# Patient Record
Sex: Male | Born: 1985 | Race: Black or African American | Hispanic: No | Marital: Single | Smoking: Former smoker
Health system: Southern US, Community
[De-identification: ages and names within clinical notes are randomized; demographics above are authoritative.]

## PROBLEM LIST (undated history)

## (undated) DIAGNOSIS — K219 Gastro-esophageal reflux disease without esophagitis: Secondary | ICD-10-CM

## (undated) HISTORY — DX: Gastro-esophageal reflux disease without esophagitis: K21.9

---

## 2009-07-14 ENCOUNTER — Emergency Department (HOSPITAL_BASED_OUTPATIENT_CLINIC_OR_DEPARTMENT_OTHER): Admission: EM | Admit: 2009-07-14 | Discharge: 2009-07-14 | Payer: Self-pay | Admitting: Emergency Medicine

## 2010-05-06 ENCOUNTER — Emergency Department (HOSPITAL_BASED_OUTPATIENT_CLINIC_OR_DEPARTMENT_OTHER)
Admission: EM | Admit: 2010-05-06 | Discharge: 2010-05-06 | Disposition: A | Discharge status: Home / Self Care | Payer: Self-pay | Attending: Emergency Medicine | Admitting: Emergency Medicine

## 2010-05-06 DIAGNOSIS — R1013 Epigastric pain: Secondary | ICD-10-CM | POA: Insufficient documentation

## 2010-05-06 DIAGNOSIS — K2970 Gastritis, unspecified, without bleeding: Secondary | ICD-10-CM | POA: Insufficient documentation

## 2010-05-06 DIAGNOSIS — K2990 Gastroduodenitis, unspecified, without bleeding: Secondary | ICD-10-CM | POA: Insufficient documentation

## 2010-05-06 DIAGNOSIS — R1110 Vomiting, unspecified: Secondary | ICD-10-CM | POA: Insufficient documentation

## 2010-05-06 DIAGNOSIS — J029 Acute pharyngitis, unspecified: Secondary | ICD-10-CM | POA: Insufficient documentation

## 2010-05-06 LAB — CBC
HCT: 44.1 % (ref 39.0–52.0)
MCH: 29.2 pg (ref 26.0–34.0)
MCHC: 34.5 g/dL (ref 30.0–36.0)
MCV: 84.8 fL (ref 78.0–100.0)
Platelets: 250 10*3/uL (ref 150–400)
RDW: 12.5 % (ref 11.5–15.5)

## 2010-05-06 LAB — DIFFERENTIAL
Eosinophils Absolute: 0 10*3/uL (ref 0.0–0.7)
Eosinophils Relative: 0 % (ref 0–5)
Lymphocytes Relative: 11 % — ABNORMAL LOW (ref 12–46)
Lymphs Abs: 0.8 10*3/uL (ref 0.7–4.0)
Monocytes Absolute: 0.9 10*3/uL (ref 0.1–1.0)

## 2010-05-06 LAB — COMPREHENSIVE METABOLIC PANEL
Albumin: 4.8 g/dL (ref 3.5–5.2)
Alkaline Phosphatase: 60 U/L (ref 39–117)
BUN: 10 mg/dL (ref 6–23)
Calcium: 9.8 mg/dL (ref 8.4–10.5)
Creatinine, Ser: 0.9 mg/dL (ref 0.4–1.5)
Glucose, Bld: 96 mg/dL (ref 70–99)
Total Protein: 8.5 g/dL — ABNORMAL HIGH (ref 6.0–8.3)

## 2010-05-06 LAB — LIPASE, BLOOD: Lipase: 46 U/L (ref 23–300)

## 2010-06-17 LAB — URINALYSIS, ROUTINE W REFLEX MICROSCOPIC
Ketones, ur: NEGATIVE mg/dL
Nitrite: NEGATIVE
Specific Gravity, Urine: 1.021 (ref 1.005–1.030)
pH: 6 (ref 5.0–8.0)

## 2010-06-17 LAB — GC/CHLAMYDIA PROBE AMP, GENITAL: GC Probe Amp, Genital: NEGATIVE

## 2010-06-17 LAB — RPR: RPR Ser Ql: NONREACTIVE

## 2010-07-15 ENCOUNTER — Emergency Department (INDEPENDENT_AMBULATORY_CARE_PROVIDER_SITE_OTHER): Payer: Self-pay

## 2010-07-15 ENCOUNTER — Emergency Department (HOSPITAL_BASED_OUTPATIENT_CLINIC_OR_DEPARTMENT_OTHER)
Admission: EM | Admit: 2010-07-15 | Discharge: 2010-07-15 | Disposition: A | Discharge status: Home / Self Care | Payer: Self-pay | Attending: Emergency Medicine | Admitting: Emergency Medicine

## 2010-07-15 DIAGNOSIS — R369 Urethral discharge, unspecified: Secondary | ICD-10-CM | POA: Insufficient documentation

## 2010-07-15 DIAGNOSIS — N342 Other urethritis: Secondary | ICD-10-CM | POA: Insufficient documentation

## 2010-07-15 DIAGNOSIS — R079 Chest pain, unspecified: Secondary | ICD-10-CM | POA: Insufficient documentation

## 2010-07-15 DIAGNOSIS — F1210 Cannabis abuse, uncomplicated: Secondary | ICD-10-CM | POA: Insufficient documentation

## 2010-12-20 ENCOUNTER — Encounter: Payer: Self-pay | Admitting: *Deleted

## 2010-12-20 ENCOUNTER — Emergency Department (HOSPITAL_BASED_OUTPATIENT_CLINIC_OR_DEPARTMENT_OTHER)
Admission: EM | Admit: 2010-12-20 | Discharge: 2010-12-20 | Disposition: A | Discharge status: Home / Self Care | Payer: Self-pay | Attending: Emergency Medicine | Admitting: Emergency Medicine

## 2010-12-20 DIAGNOSIS — R109 Unspecified abdominal pain: Secondary | ICD-10-CM | POA: Insufficient documentation

## 2010-12-20 DIAGNOSIS — F17200 Nicotine dependence, unspecified, uncomplicated: Secondary | ICD-10-CM | POA: Insufficient documentation

## 2010-12-20 DIAGNOSIS — R1030 Lower abdominal pain, unspecified: Secondary | ICD-10-CM

## 2010-12-20 LAB — URINALYSIS, MICROSCOPIC ONLY
Glucose, UA: NEGATIVE mg/dL
Hgb urine dipstick: NEGATIVE
Ketones, ur: NEGATIVE mg/dL
Leukocytes, UA: NEGATIVE
pH: 5.5 (ref 5.0–8.0)

## 2010-12-20 MED ORDER — AZITHROMYCIN 1 G PO PACK: 1.0000 g | PACK | Freq: Once | ORAL | Status: DC

## 2010-12-20 MED ORDER — METRONIDAZOLE 500 MG PO TABS
2000.0000 mg | ORAL_TABLET | Freq: Once | ORAL | Status: AC
  Administered 2010-12-20: 2000 mg via ORAL
  Filled 2010-12-20: qty 4

## 2010-12-20 MED ORDER — CEFTRIAXONE SODIUM 250 MG IJ SOLR
250.0000 mg | Freq: Once | INTRAMUSCULAR | Status: AC
  Administered 2010-12-20: 250 mg via INTRAMUSCULAR
  Filled 2010-12-20: qty 250

## 2010-12-20 MED ORDER — AZITHROMYCIN 250 MG PO TABS
1000.0000 mg | ORAL_TABLET | Freq: Once | ORAL | Status: AC
  Administered 2010-12-20: 1000 mg via ORAL
  Filled 2010-12-20: qty 4

## 2010-12-20 NOTE — ED Provider Notes (Signed)
History     CSN: [id number] Arrival date & time: 12/20/2010  2:09 PM  Chief Complaint  Patient presents with  . Groin Pain    HPI  (Consider location/radiation/quality/duration/timing/severity/associated sxs/prior treatment)  Patient is a 25 y.o. male presenting with groin pain. The history is provided by the patient.  Groin Pain This is a new problem. The current episode started 12 to 24 hours ago. The problem occurs constantly. The problem has not changed since onset.Pertinent negatives include no chest pain, no abdominal pain, no headaches and no shortness of breath. He has tried nothing for the symptoms.  ENCOUNTER WITH [employer]IPPER AND CONCERNED ABOUT STD HAD BILAT GROIN PAIN FOR ONE DAY. NO DISCHARGE NO MASS.   History reviewed. No pertinent past medical history.  History reviewed. No pertinent past surgical history.  No family history on file.  History  Substance Use Topics  . Smoking status: Current Everyday Smoker  . Smokeless tobacco: Not on file  . Alcohol Use: Yes      Review of Systems  Review of Systems  Constitutional: Negative for fever.  HENT: Negative for neck pain.   Respiratory: Negative for cough and shortness of breath.   Cardiovascular: Negative for chest pain.  Gastrointestinal: Negative for nausea and abdominal pain.  Genitourinary: Negative for dysuria, hematuria, flank pain, scrotal swelling, genital sores, penile pain and testicular pain.  Musculoskeletal: Negative for back pain.  Neurological: Negative for headaches.  Hematological: Negative for adenopathy.    Allergies  Review of patient's allergies indicates no known allergies.  Home Medications  No current outpatient prescriptions on file.  Physical Exam    BP 120/66  Pulse 82  Temp(Src) 98.2 F (36.8 C) (Oral)  Resp 18  Ht 5\' 9"  (1.753 m)  Wt 185 lb (83.915 kg)  BMI 27.32 kg/m2  SpO2 97%  Physical Exam  Nursing note and vitals reviewed. Constitutional: He is oriented to  person, place, and time. He appears well-developed and well-nourished. No distress.  HENT:  Head: Normocephalic and atraumatic.  Mouth/Throat: Oropharynx is clear and moist.  Eyes: Conjunctivae and EOM are normal. Pupils are equal, round, and reactive to light.  Neck: Normal range of motion. Neck supple.  Cardiovascular: Normal rate, regular rhythm and normal heart sounds.   Pulmonary/Chest: Effort normal and breath sounds normal.  Abdominal: Soft. Bowel sounds are normal. He exhibits no mass. There is no tenderness.  Genitourinary: Penis normal. No penile tenderness.       NO HERNIA NO ADENOPATHY NO DISCHARGE NO LESIONS.   Musculoskeletal: Normal range of motion. He exhibits no tenderness.  Neurological: He is alert and oriented to person, place, and time.  Skin: Skin is warm. No rash noted.    ED Course  Procedures (including critical care time)  Labs Reviewed  URINALYSIS, MICROSCOPIC ONLY - Abnormal; Notable for the following:    Bacteria, UA MANY (*)    All other components within normal limits   No results found.   1. Groin pain      MDM CLINICALLY NORMAL GU EXAM NO EVIDENCE OF STD OR HERNIA BUT PATIENT CONCERNED ABOUT RECENT ENCOUNTER - WILL RX FOR STD.           Shelda Jakes, MD 12/20/10 [phone]

## 2010-12-20 NOTE — ED Notes (Signed)
Pt states he has had intermittent groin pain for 2-3 days. Describes as sharp. Denies UTI S/S or d/c.

## 2010-12-20 NOTE — ED Notes (Signed)
D/c home with ride- no rx given 

## 2012-01-12 ENCOUNTER — Encounter (HOSPITAL_BASED_OUTPATIENT_CLINIC_OR_DEPARTMENT_OTHER): Payer: Self-pay | Admitting: *Deleted

## 2012-01-12 ENCOUNTER — Emergency Department (HOSPITAL_BASED_OUTPATIENT_CLINIC_OR_DEPARTMENT_OTHER)
Admission: EM | Admit: 2012-01-12 | Discharge: 2012-01-12 | Disposition: A | Discharge status: Home / Self Care | Payer: Self-pay | Attending: Emergency Medicine | Admitting: Emergency Medicine

## 2012-01-12 DIAGNOSIS — Z87891 Personal history of nicotine dependence: Secondary | ICD-10-CM | POA: Insufficient documentation

## 2012-01-12 DIAGNOSIS — Z202 Contact with and (suspected) exposure to infections with a predominantly sexual mode of transmission: Secondary | ICD-10-CM | POA: Insufficient documentation

## 2012-01-12 MED ORDER — AZITHROMYCIN 1 G PO PACK
1.0000 g | PACK | Freq: Once | ORAL | Status: AC
  Administered 2012-01-12: 1 g via ORAL
  Filled 2012-01-12: qty 1

## 2012-01-12 MED ORDER — CEFTRIAXONE SODIUM 250 MG IJ SOLR
250.0000 mg | Freq: Once | INTRAMUSCULAR | Status: AC
  Administered 2012-01-12: 250 mg via INTRAMUSCULAR
  Filled 2012-01-12: qty 250

## 2012-01-12 MED ORDER — LIDOCAINE HCL (PF) 1 % IJ SOLN
INTRAMUSCULAR | Status: AC
  Administered 2012-01-12: 5 mL
  Filled 2012-01-12: qty 5

## 2012-01-12 NOTE — ED Notes (Signed)
Cramp like pain in his lower abdomen x 2 weeks. Denies nausea or diarrhea.

## 2012-01-12 NOTE — ED Provider Notes (Signed)
History     CSN: [id number]  Arrival date & time 01/12/12  1751   First MD Initiated Contact with Patient 01/12/12 1800      Chief Complaint  Patient presents with  . Abdominal Pain     HPI Patient recently found out that his pregnant girlfriend has chlamydia.  She sent him here for treatment.  Patient has had no symptoms of drainage or dysuria.  Patient's has no other previous medical history.  Denies fever chills nausea vomiting or diarrhea.  Denies rash. History reviewed. No pertinent past medical history.  History reviewed. No pertinent past surgical history.  No family history on file.  History  Substance Use Topics  . Smoking status: Former Games developer  . Smokeless tobacco: Not on file  . Alcohol Use: Yes      Review of Systems  All other systems reviewed and are negative.    Allergies  Review of patient's allergies indicates no known allergies.  Home Medications  No current outpatient prescriptions on file.  BP 146/72  Pulse 70  Temp 98.3 F (36.8 C) (Oral)  Resp 16  SpO2 100%  Physical Exam  Nursing note and vitals reviewed. Constitutional: He is oriented to person, place, and time. He appears well-developed and well-nourished. No distress.  HENT:  Head: Normocephalic and atraumatic.  Eyes: Pupils are equal, round, and reactive to light.  Neck: Normal range of motion.  Cardiovascular: Normal rate and intact distal pulses.   Pulmonary/Chest: No respiratory distress.  Abdominal: Normal appearance. He exhibits no distension.  Genitourinary: Penis normal.       No rash no urethral discharge  Musculoskeletal: Normal range of motion.  Neurological: He is alert and oriented to person, place, and time. No cranial nerve deficit.  Skin: Skin is warm and dry. No rash noted.  Psychiatric: He has a normal mood and affect. His behavior is normal.    ED Course  Procedures (including critical care time)  Medications  cefTRIAXone (ROCEPHIN) injection 250 mg  (not administered)  azithromycin (ZITHROMAX) powder 1 g (not administered)    Labs Reviewed - No data to display No results found.   1. Exposure to STD       MDM          Nelia Shi, MD 01/13/12 1159

## 2012-11-02 ENCOUNTER — Emergency Department (HOSPITAL_COMMUNITY)
Admission: EM | Admit: 2012-11-02 | Discharge: 2012-11-02 | Disposition: A | Discharge status: Home / Self Care | Payer: Self-pay | Attending: Emergency Medicine | Admitting: Emergency Medicine

## 2012-11-02 ENCOUNTER — Encounter (HOSPITAL_COMMUNITY): Payer: Self-pay | Admitting: Emergency Medicine

## 2012-11-02 ENCOUNTER — Emergency Department (HOSPITAL_COMMUNITY): Payer: Self-pay

## 2012-11-02 DIAGNOSIS — S41009A Unspecified open wound of unspecified shoulder, initial encounter: Secondary | ICD-10-CM | POA: Insufficient documentation

## 2012-11-02 DIAGNOSIS — S46909A Unspecified injury of unspecified muscle, fascia and tendon at shoulder and upper arm level, unspecified arm, initial encounter: Secondary | ICD-10-CM | POA: Insufficient documentation

## 2012-11-02 DIAGNOSIS — Y92410 Unspecified street and highway as the place of occurrence of the external cause: Secondary | ICD-10-CM | POA: Insufficient documentation

## 2012-11-02 DIAGNOSIS — S4980XA Other specified injuries of shoulder and upper arm, unspecified arm, initial encounter: Secondary | ICD-10-CM | POA: Insufficient documentation

## 2012-11-02 DIAGNOSIS — M25512 Pain in left shoulder: Secondary | ICD-10-CM

## 2012-11-02 DIAGNOSIS — Y9389 Activity, other specified: Secondary | ICD-10-CM | POA: Insufficient documentation

## 2012-11-02 DIAGNOSIS — Z87891 Personal history of nicotine dependence: Secondary | ICD-10-CM | POA: Insufficient documentation

## 2012-11-02 DIAGNOSIS — S8990XA Unspecified injury of unspecified lower leg, initial encounter: Secondary | ICD-10-CM | POA: Insufficient documentation

## 2012-11-02 NOTE — ED Notes (Signed)
Attempted to d/c pt, but [employer] interviewing pt and they did not want me to come in room at this time.
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
## 2012-11-03 NOTE — ED Provider Notes (Signed)
Medical screening examination/treatment/procedure(s) were performed by non-physician practitioner and as supervising physician I was immediately available for consultation/collaboration.  Flint Melter, MD 11/03/12 1416
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
## 2017-08-25 ENCOUNTER — Emergency Department (HOSPITAL_COMMUNITY)
Admission: EM | Admit: 2017-08-25 | Discharge: 2017-08-25 | Disposition: A | Discharge status: Home / Self Care | Payer: Self-pay | Attending: Emergency Medicine | Admitting: Emergency Medicine

## 2017-08-25 ENCOUNTER — Encounter (HOSPITAL_COMMUNITY): Payer: Self-pay | Admitting: Emergency Medicine

## 2017-08-25 DIAGNOSIS — Z711 Person with feared health complaint in whom no diagnosis is made: Secondary | ICD-10-CM

## 2017-08-25 DIAGNOSIS — Z202 Contact with and (suspected) exposure to infections with a predominantly sexual mode of transmission: Secondary | ICD-10-CM | POA: Insufficient documentation

## 2017-08-25 DIAGNOSIS — Z87891 Personal history of nicotine dependence: Secondary | ICD-10-CM | POA: Insufficient documentation

## 2017-08-25 LAB — URINALYSIS, ROUTINE W REFLEX MICROSCOPIC
Bilirubin Urine: NEGATIVE
GLUCOSE, UA: NEGATIVE mg/dL
Hgb urine dipstick: NEGATIVE
KETONES UR: NEGATIVE mg/dL
LEUKOCYTES UA: NEGATIVE
Nitrite: NEGATIVE
PROTEIN: NEGATIVE mg/dL
Specific Gravity, Urine: 1.02 (ref 1.005–1.030)
pH: 7 (ref 5.0–8.0)

## 2017-08-25 MED ORDER — AZITHROMYCIN 250 MG PO TABS
1000.0000 mg | ORAL_TABLET | Freq: Once | ORAL | Status: AC
  Administered 2017-08-25: 1000 mg via ORAL
  Filled 2017-08-25: qty 4

## 2017-08-25 MED ORDER — CEFTRIAXONE SODIUM 250 MG IJ SOLR
250.0000 mg | Freq: Once | INTRAMUSCULAR | Status: AC
Start: 2017-08-25 — End: 2017-08-25
  Administered 2017-08-25: 250 mg via INTRAMUSCULAR
  Filled 2017-08-25: qty 250

## 2017-08-25 NOTE — ED Provider Notes (Signed)
[facility] EMERGENCY DEPARTMENT Provider Note   CSN: [id number] Arrival date & time: 08/25/17  1952     History   Chief Complaint Chief Complaint  Patient presents with  . Exposure to STD    HPI Keith Rodriguez is a 32 y.o. male with no significant past medical history presents today requesting STD testing.  He states his "baby mama" recently told him that she tested positive for chlamydia.  Last sexual intercourse was 2 days ago.  He states in the past 6 months he has not had any other sexual partners.  He denies any symptoms at this time.  He denies abnormal penile drainage, dysuria, urinary urgency or frequency, hematuria, genital lesions, scrotal swelling, testicular pain, or pain with bowel movements.  Denies abdominal pain, nausea, vomiting, fevers, or chills.  He declines testing for HIV, syphilis or genital exam today and states "I just want treatment ".  The history is provided by the patient.    History reviewed. No pertinent past medical history.  There are no active problems to display for this patient.   History reviewed. No pertinent surgical history.      Home Medications    Prior to Admission medications   Not on File    Family History History reviewed. No pertinent family history.  Social History Social History   Tobacco Use  . Smoking status: Former Smoker  Substance Use Topics  . Alcohol use: Yes  . Drug use: No     Allergies   Patient has no known allergies.   Review of Systems Review of Systems  Gastrointestinal: Negative for abdominal pain, diarrhea, nausea and vomiting.  Genitourinary: Negative for decreased urine volume, discharge, dysuria, frequency, hematuria, penile pain, penile swelling, scrotal swelling, testicular pain and urgency.  Musculoskeletal: Negative for back pain.     Physical Exam Updated Vital Signs BP (!) 145/91 (BP Location: Right Arm)   Pulse 69   Temp 98.7 F (37.1 C) (Oral)   Resp 18    Ht  (1.753 m)   Wt 97.5 kg (215 lb)   SpO2 100%   BMI 31.75 kg/m   Physical Exam  Constitutional: He appears well-developed and well-nourished. No distress.  HENT:  Head: Normocephalic and atraumatic.  Eyes: Conjunctivae are normal. Right eye exhibits no discharge. Left eye exhibits no discharge.  Neck: No JVD present. No tracheal deviation present.  Cardiovascular: Normal rate, regular rhythm and normal heart sounds.  Pulmonary/Chest: Effort normal and breath sounds normal.  Abdominal: Soft. Bowel sounds are normal. He exhibits no distension. There is no tenderness. There is no guarding.  No CVA tenderness  Genitourinary:  Genitourinary Comments: Patient declined.   Musculoskeletal: He exhibits no edema.  Neurological: He is alert.  Skin: Skin is warm and dry. No erythema.  Psychiatric: He has a normal mood and affect. His behavior is normal.  Nursing note and vitals reviewed.    ED Treatments / Results  Labs (all labs ordered are listed, but only abnormal results are displayed) Labs Reviewed  URINALYSIS, ROUTINE W REFLEX MICROSCOPIC  GC/CHLAMYDIA PROBE AMP ([place]) NOT AT [facility]    EKG None  Radiology No results found.  Procedures Procedures (including critical care time)  Medications Ordered in ED Medications  azithromycin (ZITHROMAX) tablet 1,000 mg (1,000 mg Oral Given 08/25/17 2251)  cefTRIAXone (ROCEPHIN) injection 250 mg (250 mg Intramuscular Given 08/25/17 2252)     Initial Impression / Assessment and Plan / ED Course  I have reviewed  the triage vital signs and the nursing notes.  Pertinent labs & imaging results that were available during my care of the patient were reviewed by me and considered in my medical decision making (see chart for details).    Patient presents requesting STD testing after being told by his significant other that she tested positive for chlamydia.  Patient is afebrile without abdominal tenderness, abdominal pain or  painful bowel movements to indicate prostatitis.  He declines GU exam but also denies any symptoms of STD and so I have a low suspicion of orchitis, epididymitis, or prostatitis.   STD cultures obtained including gonorrhea and chlamydia.  UA is not concerning for testing for HIV or syphilis despite my examination of this is the CDC recommendation. Discussed importance of using protection when sexually active. Pt understands that they have GC/Chlamydia cultures pending and that they will need to inform all sexual partners if results return positive. Patient has been treated prophylactically with azithromycin and Rocephin.  He was also instructed to follow-up at the [facility] department if he wishes any other STD testing in the future.  Final Clinical Impressions(s) / ED Diagnoses   Final diagnoses:  Concern about STD in male without diagnosis  STD exposure    ED Discharge Orders    None       Bennye Alm 08/25/17 2344    Benjiman Core, MD 08/27/17 [phone]

## 2017-08-25 NOTE — ED Triage Notes (Signed)
Pt presents with concern for STD exposure; states his girlfriend recently told him she had chlymidia; last intercourse 2 days ago; pt denies symptoms currently but wants tested and treatment

## 2017-08-25 NOTE — Discharge Instructions (Signed)
Follow up with [facility] STD clinic to be screened for HIV in the future and for future STD concerns or screenings. This is the recommendation by the CDC for people with multiple sexual partners or hx of STDs. You have been treated for gonorrhea and chlamydia in the ER but the hospital will call you if lab is positive.  Do not have sexual intercourse for 7 days after antibiotic treatment. ° °
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
## 2018-02-03 ENCOUNTER — Other Ambulatory Visit: Payer: Self-pay

## 2018-02-03 ENCOUNTER — Encounter: Payer: Self-pay | Admitting: Family Medicine

## 2018-02-03 ENCOUNTER — Ambulatory Visit: Payer: 59 | Admitting: Family Medicine

## 2018-02-03 VITALS — BP 126/79 | HR 56 | Temp 99.1°F | Resp 17 | Ht 68.19 in | Wt 216.2 lb

## 2018-02-03 DIAGNOSIS — K219 Gastro-esophageal reflux disease without esophagitis: Secondary | ICD-10-CM

## 2018-02-03 DIAGNOSIS — Z840 Family history of diseases of the skin and subcutaneous tissue: Secondary | ICD-10-CM | POA: Diagnosis not present

## 2018-02-03 DIAGNOSIS — Z23 Encounter for immunization: Secondary | ICD-10-CM

## 2018-02-03 DIAGNOSIS — E6609 Other obesity due to excess calories: Secondary | ICD-10-CM

## 2018-02-03 DIAGNOSIS — R1032 Left lower quadrant pain: Secondary | ICD-10-CM

## 2018-02-03 DIAGNOSIS — Z6832 Body mass index (BMI) 32.0-32.9, adult: Secondary | ICD-10-CM

## 2018-02-03 DIAGNOSIS — E66811 Obesity, class 1: Secondary | ICD-10-CM

## 2018-02-03 DIAGNOSIS — K644 Residual hemorrhoidal skin tags: Secondary | ICD-10-CM

## 2018-02-03 DIAGNOSIS — Z7689 Persons encountering health services in other specified circumstances: Secondary | ICD-10-CM | POA: Diagnosis not present

## 2018-02-03 DIAGNOSIS — Z8349 Family history of other endocrine, nutritional and metabolic diseases: Secondary | ICD-10-CM

## 2018-02-03 DIAGNOSIS — R0602 Shortness of breath: Secondary | ICD-10-CM | POA: Diagnosis not present

## 2018-02-03 DIAGNOSIS — Z113 Encounter for screening for infections with a predominantly sexual mode of transmission: Secondary | ICD-10-CM

## 2018-02-03 DIAGNOSIS — N50811 Right testicular pain: Secondary | ICD-10-CM

## 2018-02-03 DIAGNOSIS — R198 Other specified symptoms and signs involving the digestive system and abdomen: Secondary | ICD-10-CM

## 2018-02-03 DIAGNOSIS — Z299 Encounter for prophylactic measures, unspecified: Secondary | ICD-10-CM

## 2018-02-03 MED ORDER — PSYLLIUM 58.6 % PO POWD: 1.0000 | Freq: Two times a day (BID) | ORAL | 12 refills | Status: DC

## 2018-02-03 NOTE — Patient Instructions (Addendum)
   If you have lab work done today you will be contacted with your lab results within the next 2 weeks.  If you have not heard from us then please contact us. The fastest way to get your results is to register for My Chart.   IF you received an x-ray today, you will receive an invoice from [facility]. Please contact [facility] at [phone] with questions or concerns regarding your invoice.   IF you received labwork today, you will receive an invoice from [facility]. Please contact [facility] at [phone] with questions or concerns regarding your invoice.   Our billing staff will not be able to assist you with questions regarding bills from these companies.  You will be contacted with the lab results as soon as they are available. The fastest way to get your results is to activate your My Chart account. Instructions are located on the last page of this paperwork. If you have not heard from us regarding the results in 2 weeks, please contact this office.     Hemorrhoids Hemorrhoids are swollen veins in and around the rectum or anus. There are two types of hemorrhoids:  Internal hemorrhoids. These occur in the veins that are just inside the rectum. They may poke through to the outside and become irritated and painful.  External hemorrhoids. These occur in the veins that are outside of the anus and can be felt as a painful swelling or hard lump near the anus.  Most hemorrhoids do not cause serious problems, and they can be managed with home treatments such as diet and lifestyle changes. If home treatments do not help your symptoms, procedures can be done to shrink or remove the hemorrhoids. What are the causes? This condition is caused by increased pressure in the anal area. This pressure may result from various things, including:  Constipation.  Straining to have a bowel movement.  Diarrhea.  Pregnancy.  Obesity.  Sitting for long periods of time.  Heavy  lifting or other activity that causes you to strain.  Anal sex.  What are the signs or symptoms? Symptoms of this condition include:  Pain.  Anal itching or irritation.  Rectal bleeding.  Leakage of stool (feces).  Anal swelling.  One or more lumps around the anus.  How is this diagnosed? This condition can often be diagnosed through a visual exam. Other exams or tests may also be done, such as:  Examination of the rectal area with a gloved hand (digital rectal exam).  Examination of the anal canal using a small tube (anoscope).  A blood test, if you have lost a significant amount of blood.  A test to look inside the colon (sigmoidoscopy or colonoscopy).  How is this treated? This condition can usually be treated at home. However, various procedures may be done if dietary changes, lifestyle changes, and other home treatments do not help your symptoms. These procedures can help make the hemorrhoids smaller or remove them completely. Some of these procedures involve surgery, and others do not. Common procedures include:  Rubber band ligation. Rubber bands are placed at the base of the hemorrhoids to cut off the blood supply to them.  Sclerotherapy. Medicine is injected into the hemorrhoids to shrink them.  Infrared coagulation. A type of light energy is used to get rid of the hemorrhoids.  Hemorrhoidectomy surgery. The hemorrhoids are surgically removed, and the veins that supply them are tied off.  Stapled hemorrhoidopexy surgery. A circular stapling device is used to   remove the hemorrhoids and use staples to cut off the blood supply to them.  Follow these instructions at home: Eating and drinking  Eat foods that have a lot of fiber in them, such as whole grains, beans, nuts, fruits, and vegetables. Ask your health care provider about taking products that have added fiber (fiber supplements).  Drink enough fluid to keep your urine clear or pale yellow. Managing pain  and swelling  Take warm sitz baths for 20 minutes, 3-4 times a day to ease pain and discomfort.  If directed, apply ice to the affected area. Using ice packs between sitz baths may be helpful. ? Put ice in a plastic bag. ? Place a towel between your skin and the bag. ? Leave the ice on for 20 minutes, 2-3 times a day. General instructions  Take over-the-counter and prescription medicines only as told by your health care provider.  Use medicated creams or suppositories as told.  Exercise regularly.  Go to the bathroom when you have the urge to have a bowel movement. Do not wait.  Avoid straining to have bowel movements.  Keep the anal area dry and clean. Use wet toilet paper or moist towelettes after a bowel movement.  Do not sit on the toilet for long periods of time. This increases blood pooling and pain. Contact a health care provider if:  You have increasing pain and swelling that are not controlled by treatment or medicine.  You have uncontrolled bleeding.  You have difficulty having a bowel movement, or you are unable to have a bowel movement.  You have pain or inflammation outside the area of the hemorrhoids. This information is not intended to replace advice given to you by your health care provider. Make sure you discuss any questions you have with your health care provider. Document Released: 03/13/2000 Document Revised: 08/14/2015 Document Reviewed: 11/28/2014 Elsevier Interactive Patient Education  2018 Elsevier Inc.  High-Fiber Diet Fiber, also called dietary fiber, is a type of carbohydrate found in fruits, vegetables, whole grains, and beans. A high-fiber diet can have many health benefits. Your health care provider may recommend a high-fiber diet to help:  Prevent constipation. Fiber can make your bowel movements more regular.  Lower your cholesterol.  Relieve hemorrhoids, uncomplicated diverticulosis, or irritable bowel syndrome.  Prevent overeating as  part of a weight-loss plan.  Prevent heart disease, type 2 diabetes, and certain cancers.  What is my plan? The recommended daily intake of fiber includes:  38 grams for men under age 50.  30 grams for men over age 50.  25 grams for women under age 50.  21 grams for women over age 50.  You can get the recommended daily intake of dietary fiber by eating a variety of fruits, vegetables, grains, and beans. Your health care provider may also recommend a fiber supplement if it is not possible to get enough fiber through your diet. What do I need to know about a high-fiber diet?  Fiber supplements have not been widely studied for their effectiveness, so it is better to get fiber through food sources.  Always check the fiber content on thenutrition facts label of any prepackaged food. Look for foods that contain at least 5 grams of fiber per serving.  Ask your dietitian if you have questions about specific foods that are related to your condition, especially if those foods are not listed in the following section.  Increase your daily fiber consumption gradually. Increasing your intake of dietary fiber too quickly   may cause bloating, cramping, or gas.  Drink plenty of water. Water helps you to digest fiber. What foods can I eat? Grains Whole-grain breads. Multigrain cereal. Oats and oatmeal. Brown rice. Barley. Bulgur wheat. Millet. Bran muffins. Popcorn. Rye wafer crackers. Vegetables Sweet potatoes. Spinach. Kale. Artichokes. Cabbage. Broccoli. Green peas. Carrots. Squash. Fruits Berries. Pears. Apples. Oranges. Avocados. Prunes and raisins. Dried figs. Meats and Other Protein Sources Navy, kidney, pinto, and soy beans. Split peas. Lentils. Nuts and seeds. Dairy Fiber-fortified yogurt. Beverages Fiber-fortified soy milk. Fiber-fortified orange juice. Other Fiber bars. The items listed above may not be a complete list of recommended foods or beverages. Contact your dietitian for  more options. What foods are not recommended? Grains White bread. Pasta made with refined flour. White rice. Vegetables Fried potatoes. Canned vegetables. Well-cooked vegetables. Fruits Fruit juice. Cooked, strained fruit. Meats and Other Protein Sources Fatty cuts of meat. Fried poultry or fried fish. Dairy Milk. Yogurt. Cream cheese. Sour cream. Beverages Soft drinks. Other Cakes and pastries. Butter and oils. The items listed above may not be a complete list of foods and beverages to avoid. Contact your dietitian for more information. What are some tips for including high-fiber foods in my diet?  Eat a wide variety of high-fiber foods.  Make sure that half of all grains consumed each day are whole grains.  Replace breads and cereals made from refined flour or white flour with whole-grain breads and cereals.  Replace white rice with brown rice, bulgur wheat, or millet.  Start the day with a breakfast that is high in fiber, such as a cereal that contains at least 5 grams of fiber per serving.  Use beans in place of meat in soups, salads, or pasta.  Eat high-fiber snacks, such as berries, raw vegetables, nuts, or popcorn. This information is not intended to replace advice given to you by your health care provider. Make sure you discuss any questions you have with your health care provider. Document Released: 03/16/2005 Document Revised: 08/22/2015 Document Reviewed: 08/29/2013 Elsevier Interactive Patient Education  2018 Elsevier Inc.  

## 2018-02-04 LAB — H. PYLORI BREATH TEST: H. pylori Breath Test: NOT DETECTED

## 2018-02-04 LAB — GC/CHLAMYDIA PROBE AMP
Chlamydia trachomatis, NAA: NEGATIVE
Neisseria gonorrhoeae by PCR: NEGATIVE

## 2018-02-11 ENCOUNTER — Telehealth: Payer: Self-pay | Admitting: Family Medicine

## 2018-02-11 ENCOUNTER — Ambulatory Visit
Admission: RE | Admit: 2018-02-11 | Discharge: 2018-02-11 | Disposition: A | Discharge status: Home / Self Care | Payer: 59 | Source: Ambulatory Visit | Attending: Family Medicine | Admitting: Family Medicine

## 2018-02-11 DIAGNOSIS — N50811 Right testicular pain: Secondary | ICD-10-CM | POA: Diagnosis not present

## 2018-02-11 NOTE — Telephone Encounter (Signed)
Copied from CRM [id number]. Topic: General - Other >> Feb 11, 2018  2:28 PM Tamela OddiHarris, Brenda J wrote: Reason for CRM: Patient called to inquire about the ultrasound he just had.  Patient stated that they took an ultrasound of his testicles and he wanted the ultrasound to be for his stomach.  Patient stated that this is want the doctor had told him would be done.  Patient would like to speak with the nurse or doctor to find out why the ultrasound was not done on his stomach.  Please advise.  CB# [phone]

## 2018-02-13 NOTE — Telephone Encounter (Signed)
Message sent to Dr. Creta LevinStallings Re: ultrasound - pt questions it was ordered correctly

## 2018-02-15 ENCOUNTER — Ambulatory Visit: Payer: Self-pay

## 2018-02-15 NOTE — Telephone Encounter (Signed)
Patient called he was seen by Dr Creta Levin a few weeks ago for some abdominal issues. Stated that she spoke about getting him scheduled for an abdominal ultrasound but he has heard nothing. He is still vomiting in the mornings, also was not able to get Rx filled that was prescribed for psyllium (METAMUCIL MULTIHEALTH FIBER) 58.6 % powder. Pt c/o left sided abdominal pain. Pt stated that when he drinks water, it burns his stomach. Pt stated that the pain can be felt to his hemorrhoids when he has the abdominal pian. Pt stated that he has had flecks of blood in his emesis.Pt stated that smells can trigger his nausea and vomiting. Pt stated that he has had abdominal pain for 2 months. Care advice given and pt stated that he does not want to come in to a office visit. Pt stated that Dr Creta Levin know everything and that he cannot take off work so often to come in to see the doctor.  Spoke with Malachi Bonds at [facility] and informed her that pt refusing a doctor appointment. Asked to send note to office.  Reason for Disposition . Abdominal pain is a chronic symptom (recurrent or ongoing AND present > 4 weeks)  Answer Assessment - Initial Assessment Questions 1. LOCATION: "Where does it hurt?"      Left side of abdomin when he drinks water on empty stomach it burns his stomach 2. RADIATION: "Does the pain shoot anywhere else?" (e.g., chest, back)     No shoots to hemorrhoids pt stated it burns 3. ONSET: "When did the pain begin?" (Minutes, hours or days ago)      2 months ago 4. SUDDEN: "Gradual or sudden onset?"     suddenly 5. PATTERN "Does the pain come and go, or is it constant?"    - If constant: "Is it getting better, staying the same, or worsening?"      (Note: Constant means the pain never goes away completely; most serious pain is constant and it progresses)     - If intermittent: "How long does it last?" "Do you have pain now?"     (Note: Intermittent means the pain goes away completely between bouts)     Comes and goes-hour and a half- has pain now drinking a smoothie helps occasionally 6. SEVERITY: "How bad is the pain?"  (e.g., Scale 1-10; mild, moderate, or severe)    - MILD (1-3): doesn't interfere with normal activities, abdomen soft and not tender to touch     - MODERATE (4-7): interferes with normal activities or awakens from sleep, tender to touch     - SEVERE (8-10): excruciating pain, doubled over, unable to do any normal activities       Moderate pt stated that it get worse dependant on what he eats- smells can trigger it clear thick phlegm that pt is throwing up 7. RECURRENT SYMPTOM: "Have you ever had this type of abdominal pain before?" If so, ask: "When was the last time?" and "What happened that time?"  no 8. CAUSE: "What do you think is causing the abdominal pain?"     Drinks beer stated that family has stomach issues like ulcers, cousin with Chrons  9. RELIEVING/AGGRAVATING FACTORS: "What makes it better or worse?" (e.g., movement, antacids, bowel movement)     Depends on what he eats-no relieving factors 10. OTHER SYMPTOMS: "Has there been any vomiting, diarrhea, constipation, or urine problems?"       Stated that he is throwing up under an ounce yesterday,  vomiting, hemorrhoids  Protocols used: ABDOMINAL PAIN - MALE-A-AH

## 2018-02-16 ENCOUNTER — Telehealth: Payer: Self-pay | Admitting: Family Medicine

## 2018-02-16 DIAGNOSIS — N50811 Right testicular pain: Secondary | ICD-10-CM

## 2018-02-16 NOTE — Telephone Encounter (Signed)
Tried to call pt to try and reschedule their appt for 04/07/18 due to provider schedule change. I could not leave a message due to the mailbox being full.   If pt calls back, please reschedule pt for a CPE at their convenience.

## 2018-02-16 NOTE — Telephone Encounter (Signed)
Please adv    Copied from CRM [id number]. Topic: General - Inquiry >> Feb 16, 2018  2:29 PM Lynne LoganHudson, Caryn D wrote: Reason for CRM: Pt stated he is waiting to hear back about having a possible second ultrasound. He is still having discomfort and is not clear on what the next step for him is supposed to be. He would like a call back regarding this. Cb#[phone]

## 2018-02-17 NOTE — Telephone Encounter (Signed)
Called but couldn't leave VM.

## 2018-02-17 NOTE — Telephone Encounter (Signed)
FYI

## 2018-02-20 NOTE — Telephone Encounter (Signed)
I do not know what a second ultrasound would show but I am going to refer to the Urologist who may be able to do an exam in the office and do a bedside ultrasound to help figure out the cause. I have placed the referral for testicular pain.

## 2018-02-20 NOTE — Addendum Note (Signed)
Addended by: Collie SiadSTALLINGS, Rik Wadel A on: 02/20/2018 07:10 PM   Modules accepted: Orders

## 2018-02-20 NOTE — Telephone Encounter (Signed)
Called patient. Unable to leave voicemail as the mailbox is full. Patient needs to go to Urgent Care for evaluation if we have no appointment.  Let him know that I sent in a referral for the Urologist to check out his testicular pain but the abdominal pain needs reevaluation because it may need a different imaging study. The fact that he can feel this pain even in his hemorrhoids and there is vomiting is concerning. In the office he did not indicate any of this level of severity.

## 2018-02-21 ENCOUNTER — Telehealth: Payer: Self-pay | Admitting: Family Medicine

## 2018-02-21 NOTE — Telephone Encounter (Signed)
Still unable to reach patient by phone. Unable to leave a msg.

## 2018-02-21 NOTE — Telephone Encounter (Signed)
Copied from CRM [phone]. Topic: General - Other >> Feb 21, 2018  9:46 AM Ronney LionArrington, Shykila A wrote: Reason for CRM: Pt called in wanting to know if Dr. Creta LevinStallings will send him in a medication for his acid Reflux, to the pharmacy on file.

## 2018-02-21 NOTE — Telephone Encounter (Signed)
Patient is calling for lab results- no notes on lab results.

## 2018-02-21 NOTE — Telephone Encounter (Signed)
Copied from CRM [id number]. Topic: General - Other >> Feb 21, 2018  9:42 AM Ronney LionArrington, Shykila A wrote: Reason for CRM: pt called in wanting to have a nurse call him back in regards to his lab work. He says he never received a call.

## 2018-02-22 NOTE — Telephone Encounter (Signed)
Spoke with patient he wants to know what kind of RX will you be calling in for his acid reflex medication. He thinks his stomach pain is coming from the acid reflex and he figures once he get this under control his stomach pain will go away. Patient states he can not keep coming in to the office for the same problem and the problem is not getting fixed. He can not keep taking off work.  Patient also wants to know his lab results.

## 2018-02-22 NOTE — Telephone Encounter (Signed)
Called but unable to leave vm.

## 2018-02-22 NOTE — Telephone Encounter (Signed)
Spoke with patient he wants to know what kind of RX will you be calling in for his acid reflex medication. He thinks his stomach pain is coming from the acid reflex and he figures once he get this under control his stomach pain will go away. Patient states he can not keep coming in to the office for the same problem and the problem is not getting fixed. He can not keep taking off work.  Patient also wants to know his lab results. 

## 2018-02-25 MED ORDER — OMEPRAZOLE 20 MG PO CPDR: 20.0000 mg | DELAYED_RELEASE_CAPSULE | Freq: Every day | ORAL | 3 refills | Status: DC

## 2018-02-25 NOTE — Telephone Encounter (Signed)
L/m for patient to return call.  Need to confirm if he had blood drawn at his 11/07 visit.

## 2018-02-25 NOTE — Telephone Encounter (Signed)
Please let the patient know that his H. Pylori test for reflux was negative. He may start omeprazole. I will send to the pharmacy. He should take omeprazole 20mg  first thing in the morning. He should follow up with [facility] about his groin pain. His ultrasound of the scrotum was normal.

## 2018-02-25 NOTE — Addendum Note (Signed)
Addended by: Collie SiadSTALLINGS, Raeanne Deschler A on: 02/25/2018 04:18 PM   Modules accepted: Orders

## 2018-02-25 NOTE — Telephone Encounter (Signed)
No send out labs resulted from 11/07.  Info given to lab to research.

## 2018-02-26 NOTE — Telephone Encounter (Signed)
TC to patient.  L/m with Dr. Creta LevinStallings' message.  Advised to c/b with questions.

## 2018-03-02 NOTE — Telephone Encounter (Signed)
Pt stated he does not feel very secure in his care. He has not had any results from labs or the ultrasound he had. He has had to reschedule his appts multiple times and he is not very happy with the care that he has received thus far. He would like to be contacted regarding lab results and a physical appt. He would like to change to a different PCP. Please advise CB# [phone]

## 2018-03-02 NOTE — Telephone Encounter (Signed)
Please advise 

## 2018-03-16 ENCOUNTER — Ambulatory Visit: Payer: 59 | Admitting: Emergency Medicine

## 2018-03-26 ENCOUNTER — Emergency Department (HOSPITAL_COMMUNITY)
Admission: EM | Admit: 2018-03-26 | Discharge: 2018-03-26 | Disposition: A | Discharge status: Home / Self Care | Payer: 59 | Attending: Emergency Medicine | Admitting: Emergency Medicine

## 2018-03-26 ENCOUNTER — Encounter (HOSPITAL_COMMUNITY): Payer: Self-pay

## 2018-03-26 DIAGNOSIS — Z79899 Other long term (current) drug therapy: Secondary | ICD-10-CM | POA: Diagnosis not present

## 2018-03-26 DIAGNOSIS — Z202 Contact with and (suspected) exposure to infections with a predominantly sexual mode of transmission: Secondary | ICD-10-CM | POA: Insufficient documentation

## 2018-03-26 DIAGNOSIS — R1030 Lower abdominal pain, unspecified: Secondary | ICD-10-CM | POA: Diagnosis not present

## 2018-03-26 DIAGNOSIS — Z87891 Personal history of nicotine dependence: Secondary | ICD-10-CM | POA: Diagnosis not present

## 2018-03-26 LAB — URINALYSIS, ROUTINE W REFLEX MICROSCOPIC
BILIRUBIN URINE: NEGATIVE
Glucose, UA: NEGATIVE mg/dL
HGB URINE DIPSTICK: NEGATIVE
KETONES UR: NEGATIVE mg/dL
Leukocytes, UA: NEGATIVE
NITRITE: NEGATIVE
PH: 5 (ref 5.0–8.0)
Protein, ur: NEGATIVE mg/dL
SPECIFIC GRAVITY, URINE: 1.02 (ref 1.005–1.030)

## 2018-03-26 MED ORDER — CEFTRIAXONE SODIUM 250 MG IJ SOLR
250.0000 mg | Freq: Once | INTRAMUSCULAR | Status: AC
  Administered 2018-03-26: 250 mg via INTRAMUSCULAR
  Filled 2018-03-26: qty 250

## 2018-03-26 MED ORDER — LIDOCAINE HCL (PF) 1 % IJ SOLN
INTRAMUSCULAR | Status: AC
  Administered 2018-03-26: 5 mL
  Filled 2018-03-26: qty 5

## 2018-03-26 MED ORDER — AZITHROMYCIN 250 MG PO TABS
1000.0000 mg | ORAL_TABLET | Freq: Once | ORAL | Status: AC
  Administered 2018-03-26: 1000 mg via ORAL
  Filled 2018-03-26: qty 4

## 2018-03-26 NOTE — ED Provider Notes (Signed)
[facility] Provider Note   CSN: [id number] Arrival date & time: 03/26/18  1446     History   Chief Complaint Chief Complaint  Patient presents with  . Exposure to STD  . Groin Pain    HPI Keith Ruizaron Rodriguez is a 32 y.o. male.  The history is provided by the patient. No language interpreter was used.  Exposure to STD   Groin Pain      32 year old male presenting with concern for STI.  Patient report for the past 4 to 5 days he noticed some discomfort in his groin region, he also reports discomfort at the tip of his penis, endorsed discomfort and sensation to his lower abdomen.  The symptoms are mild however he admits that he had an unprotected sex and he was concerned for potential STI.  He report prior history of STI including gonorrhea, chlamydia, and trichomonas.  He has had 3 separate sexual partners within the past 6 months, using protection on only on occasion.  He denies any associated fever chills, burning on urination or penile discharge.  No specific treatment tried.  Past Medical History:  Diagnosis Date  . Acid reflux     There are no active problems to display for this patient.   History reviewed. No pertinent surgical history.      Home Medications    Prior to Admission medications   Medication Sig Start Date End Date Taking? Authorizing Provider  omeprazole (PRILOSEC) 20 MG capsule Take 1 capsule (20 mg total) by mouth daily. 02/25/18   Doristine BosworthStallings, Zoe A, MD  psyllium (METAMUCIL MULTIHEALTH FIBER) 58.6 % powder Take 1 packet by mouth 2 (two) times daily at 8 am and 10 pm. 02/03/18   Doristine BosworthStallings, Zoe A, MD    Family History Family History  Problem Relation Age of Onset  . Lupus Mother   . Hypertension Mother   . Hyperlipidemia Mother   . Gout Father   . Hypertension Father   . Hyperlipidemia Father   . Mental illness Father   . Diabetes Sister   . Mental illness Sister   . ADD / ADHD Brother   . ADD / ADHD  Brother     Social History Social History   Tobacco Use  . Smoking status: Former Games developermoker  . Smokeless tobacco: Never Used  Substance Use Topics  . Alcohol use: Yes  . Drug use: No     Allergies   Patient has no known allergies.   Review of Systems Review of Systems  All other systems reviewed and are negative.    Physical Exam Updated Vital Signs BP (!) 148/93 (BP Location: Right Arm)   Pulse 86   Temp 98 F (36.7 C) (Oral)   Resp 16   SpO2 98%   Physical Exam Vitals signs and nursing note reviewed. Exam conducted with a chaperone present.  Constitutional:      General: He is not in acute distress.    Appearance: He is well-developed.  HENT:     Head: Atraumatic.  Eyes:     Conjunctiva/sclera: Conjunctivae normal.  Neck:     Musculoskeletal: Neck supple.  Abdominal:     Tenderness: There is no abdominal tenderness.  Genitourinary:    Penis: Normal.      Scrotum/Testes: Normal.     Comments: No inguinal lymphadenopathy or inguinal hernia noted.  Normal circumcised penis free of lesion or rash, testicles are nontender with normal lie, normal scrotum. Skin:  Findings: No rash.  Neurological:     Mental Status: He is alert.      ED Treatments / Results  Labs (all labs ordered are listed, but only abnormal results are displayed) Labs Reviewed  RPR  HIV ANTIBODY (ROUTINE TESTING W REFLEX)  URINALYSIS, ROUTINE W REFLEX MICROSCOPIC  GC/CHLAMYDIA PROBE AMP ([place]) NOT AT [facility]    EKG None  Radiology No results found.  Procedures Procedures (including critical care time)  Medications Ordered in ED Medications  cefTRIAXone (ROCEPHIN) injection 250 mg (has no administration in time range)  azithromycin (ZITHROMAX) tablet 1,000 mg (has no administration in time range)     Initial Impression / Assessment and Plan / ED Course  I have reviewed the triage vital signs and the nursing notes.  Pertinent labs & imaging results that were  available during my care of the patient were reviewed by me and considered in my medical decision making (see chart for details).     BP (!) 148/93 (BP Location: Right Arm)   Pulse 86   Temp 98 F (36.7 C) (Oral)   Resp 16   SpO2 98%    Final Clinical Impressions(s) / ED Diagnoses   Final diagnoses:  None    ED Discharge Orders    None     4:17 PM Patient presents with concerns of potential STI.  Will perform STI screening as well as given antibiotic for prophylaxis.  Encourage patient to abstain from sexual activities until symptoms completely resolved.   Fayrene Helperran, Drayke Grabel, PA-C 03/26/18 1619    Gwyneth SproutPlunkett, Whitney, MD 03/27/18 1309

## 2018-03-26 NOTE — ED Triage Notes (Signed)
Pt endorses recent unprotected sex and since has had groin discomfort. Hx of GC Chlamydia and this feels the same. VSS

## 2018-03-26 NOTE — ED Notes (Signed)
Patient verbalizes understanding of discharge instructions. Opportunity for questioning and answers were provided. Armband removed by staff, pt discharged from ED ambulatory.   

## 2018-03-27 LAB — HIV ANTIBODY (ROUTINE TESTING W REFLEX): HIV Screen 4th Generation wRfx: NONREACTIVE

## 2018-03-27 LAB — RPR: RPR Ser Ql: NONREACTIVE

## 2018-03-28 LAB — GC/CHLAMYDIA PROBE AMP (~~LOC~~) NOT AT ARMC
CHLAMYDIA, DNA PROBE: NEGATIVE
NEISSERIA GONORRHEA: NEGATIVE

## 2018-04-05 ENCOUNTER — Encounter: Payer: 59 | Admitting: Family Medicine

## 2018-04-07 ENCOUNTER — Encounter: Payer: 59 | Admitting: Family Medicine

## 2018-04-10 ENCOUNTER — Other Ambulatory Visit: Payer: Self-pay

## 2018-04-10 ENCOUNTER — Emergency Department
Admission: EM | Admit: 2018-04-10 | Discharge: 2018-04-10 | Disposition: A | Discharge status: Home / Self Care | Payer: 59 | Attending: Emergency Medicine | Admitting: Emergency Medicine

## 2018-04-10 DIAGNOSIS — R300 Dysuria: Secondary | ICD-10-CM | POA: Insufficient documentation

## 2018-04-10 DIAGNOSIS — Z202 Contact with and (suspected) exposure to infections with a predominantly sexual mode of transmission: Secondary | ICD-10-CM

## 2018-04-10 DIAGNOSIS — R109 Unspecified abdominal pain: Secondary | ICD-10-CM | POA: Diagnosis not present

## 2018-04-10 LAB — URINALYSIS, COMPLETE (UACMP) WITH MICROSCOPIC
Bacteria, UA: NONE SEEN
Bilirubin Urine: NEGATIVE
GLUCOSE, UA: NEGATIVE mg/dL
HGB URINE DIPSTICK: NEGATIVE
Ketones, ur: 20 mg/dL — AB
Leukocytes, UA: NEGATIVE
Nitrite: NEGATIVE
PH: 5 (ref 5.0–8.0)
Protein, ur: NEGATIVE mg/dL
Specific Gravity, Urine: 1.028 (ref 1.005–1.030)

## 2018-04-10 LAB — CHLAMYDIA/NGC RT PCR (ARMC ONLY)
Chlamydia Tr: NOT DETECTED
N GONORRHOEAE: NOT DETECTED

## 2018-04-10 MED ORDER — CEFTRIAXONE SODIUM 250 MG IJ SOLR
250.0000 mg | Freq: Once | INTRAMUSCULAR | Status: AC
  Administered 2018-04-10: 250 mg via INTRAMUSCULAR
  Filled 2018-04-10: qty 250

## 2018-04-10 MED ORDER — AZITHROMYCIN 500 MG PO TABS
1000.0000 mg | ORAL_TABLET | Freq: Once | ORAL | Status: AC
  Administered 2018-04-10: 1000 mg via ORAL
  Filled 2018-04-10: qty 2

## 2018-04-10 NOTE — ED Provider Notes (Signed)
[facility] Emergency Department Provider Note  ____________________________________________  Time seen: Approximately 10:35 PM  I have reviewed the triage vital signs and the nursing notes.   HISTORY  Chief Complaint possible STD    HPI Keith Rodriguez is a 33 y.o. male who presents to the emergency department for treatment of STD after exposure.  Patient states that he slept with a girl who had not finished her treatment for either chlamydia or gonorrhea.  He has pelvic pressure and dysuria with some penile discharge.  He denies fever.  He denies back pain, nausea, or vomiting.  No alleviating measures attempted prior to arrival.   Past Medical History:  Diagnosis Date  . Acid reflux     There are no active problems to display for this patient.   History reviewed. No pertinent surgical history.  Prior to Admission medications   Medication Sig Start Date End Date Taking? Authorizing Provider  omeprazole (PRILOSEC) 20 MG capsule Take 1 capsule (20 mg total) by mouth daily. 02/25/18   Doristine Bosworth, MD  psyllium (METAMUCIL MULTIHEALTH FIBER) 58.6 % powder Take 1 packet by mouth 2 (two) times daily at 8 am and 10 pm. 02/03/18   Doristine Bosworth, MD    Allergies Patient has no known allergies.  Family History  Problem Relation Age of Onset  . Lupus Mother   . Hypertension Mother   . Hyperlipidemia Mother   . Gout Father   . Hypertension Father   . Hyperlipidemia Father   . Mental illness Father   . Diabetes Sister   . Mental illness Sister   . ADD / ADHD Brother   . ADD / ADHD Brother     Social History Social History   Tobacco Use  . Smoking status: Former Games developer  . Smokeless tobacco: Never Used  Substance Use Topics  . Alcohol use: Yes  . Drug use: No    Review of Systems Constitutional: Negative for fever. Respiratory: Negative for shortness of breath or cough. Gastrointestinal: Positive for abdominal pain; negative for nausea ,  negative for vomiting. Genitourinary: Positive for dysuria , positive for penile discharge. Musculoskeletal: Negative for back pain. Skin: Negative for acute skin changes/rash/lesion. ____________________________________________   PHYSICAL EXAM:  VITAL SIGNS: ED Triage Vitals  Enc Vitals Group     BP 04/10/18 1652 (!) 129/95     Pulse Rate 04/10/18 1652 90     Resp 04/10/18 1652 18     Temp 04/10/18 1652 98.6 F (37 C)     Temp src --      SpO2 04/10/18 1652 97 %     Weight 04/10/18 1651 215 lb (97.5 kg)     Height 04/10/18 1651 5\' 9"  (1.753 m)     Head Circumference --      Peak Flow --      Pain Score 04/10/18 1651 6     Pain Loc --      Pain Edu? --      Excl. in GC? --     Constitutional: Alert and oriented. Well appearing and in no acute distress. Eyes: Conjunctivae are normal. Head: Atraumatic. Nose: No congestion/rhinnorhea. Mouth/Throat: Mucous membranes are moist. Respiratory: Normal respiratory effort.  No retractions. Gastrointestinal: Bowel sounds active x 4; Abdomen is soft without rebound or guarding. Genitourinary: Circumcised penis without lesions.  Testicles are nontender.  Normal scrotum and normal lie of the testicles. Musculoskeletal: No extremity tenderness nor edema.  Neurologic:  Normal speech and language. No  gross focal neurologic deficits are appreciated. Speech is normal. No gait instability. Skin:  Skin is warm, dry and intact. No rash noted on exposed skin. Psychiatric: Mood and affect are normal. Speech and behavior are normal.  ____________________________________________   LABS (all labs ordered are listed, but only abnormal results are displayed)  Labs Reviewed  URINALYSIS, COMPLETE (UACMP) WITH MICROSCOPIC - Abnormal; Notable for the following components:      Result Value   Color, Urine YELLOW (*)    APPearance CLEAR (*)    Ketones, ur 20 (*)    All other components within normal limits  CHLAMYDIA/NGC RT PCR ([facility]  ONLY)   ____________________________________________  RADIOLOGY  Not indicated ____________________________________________  Procedures  ____________________________________________  33 year old male presenting to the emergency department for antibiotics after STD exposure.  The urinalysis does not show any sign of acute cystitis.  The patient does not want to wait on the final results of the chlamydia and gonorrhea.  He wishes to have the antibiotics and then be discharged.  Counseling was provided to the patient that he has had recurrent visits for STD exposure and was advised that this could cause some long-term effects if he continues to behavior.  He was encouraged to avoid intercourse with anyone until symptoms have completely resolved which should be approximately 1 week or more.  He was advised that he should not have intercourse with anyone that has not been tested/treated as he may become reinfected.  He verbalized understanding.  INITIAL IMPRESSION / ASSESSMENT AND PLAN / ED COURSE  Pertinent labs & imaging results that were available during my care of the patient were reviewed by me and considered in my medical decision making (see chart for details).  ____________________________________________   FINAL CLINICAL IMPRESSION(S) / ED DIAGNOSES  Final diagnoses:  STD exposure    Note:  This document was prepared using Dragon voice recognition software and may include unintentional dictation errors.    Chinita Pesterriplett, Artie Takayama B, FNP 04/10/18 2240    Sharman CheekStafford, Phillip, MD 04/11/18 0004

## 2018-04-10 NOTE — ED Triage Notes (Signed)
Pt comes via POV from home with c/o lower pelvic pain. Pt states he thinks he may have a STD.  Pt states last intercourse was about a week ago unprotected and has since had symptoms. Pt states odor with urination. Pt denies any discharge or burning.

## 2018-04-10 NOTE — ED Notes (Signed)
See triage note  Presents with some lower abd discomfort   Thinks he may have been exposed to STD  Had unprotected sex about 1 week ago and then sxs' appeared

## 2019-02-21 ENCOUNTER — Emergency Department (HOSPITAL_COMMUNITY)
Admission: EM | Admit: 2019-02-21 | Discharge: 2019-02-21 | Disposition: A | Discharge status: Home / Self Care | Payer: 59 | Attending: Emergency Medicine | Admitting: Emergency Medicine

## 2019-02-21 ENCOUNTER — Encounter (HOSPITAL_COMMUNITY): Payer: Self-pay | Admitting: Emergency Medicine

## 2019-02-21 ENCOUNTER — Other Ambulatory Visit: Payer: Self-pay

## 2019-02-21 DIAGNOSIS — Z87891 Personal history of nicotine dependence: Secondary | ICD-10-CM | POA: Insufficient documentation

## 2019-02-21 DIAGNOSIS — Z79899 Other long term (current) drug therapy: Secondary | ICD-10-CM | POA: Insufficient documentation

## 2019-02-21 DIAGNOSIS — N341 Nonspecific urethritis: Secondary | ICD-10-CM | POA: Insufficient documentation

## 2019-02-21 DIAGNOSIS — N342 Other urethritis: Secondary | ICD-10-CM

## 2019-02-21 LAB — URINALYSIS, ROUTINE W REFLEX MICROSCOPIC
Bilirubin Urine: NEGATIVE
Glucose, UA: NEGATIVE mg/dL
Hgb urine dipstick: NEGATIVE
Ketones, ur: NEGATIVE mg/dL
Leukocytes,Ua: NEGATIVE
Nitrite: NEGATIVE
Protein, ur: NEGATIVE mg/dL
Specific Gravity, Urine: 1.02 (ref 1.005–1.030)
pH: 6 (ref 5.0–8.0)

## 2019-02-21 MED ORDER — AZITHROMYCIN 250 MG PO TABS
1000.0000 mg | ORAL_TABLET | Freq: Once | ORAL | Status: AC
  Administered 2019-02-21: 04:00:00 1000 mg via ORAL
  Filled 2019-02-21: qty 4

## 2019-02-21 MED ORDER — CEFTRIAXONE SODIUM 250 MG IJ SOLR
250.0000 mg | Freq: Once | INTRAMUSCULAR | Status: AC
  Administered 2019-02-21: 250 mg via INTRAMUSCULAR
  Filled 2019-02-21: qty 250

## 2019-02-21 NOTE — ED Triage Notes (Signed)
Pt requesting STD check. Denies symptoms at this time.

## 2019-02-21 NOTE — ED Notes (Signed)
Pt discharged from ED; instructions provided; Pt encouraged to return to ED if symptoms worsen and to f/u with PCP; Pt verbalized understanding of all instructions 

## 2019-02-21 NOTE — ED Provider Notes (Signed)
[facility] EMERGENCY DEPARTMENT Provider Note   CSN: [id number] Arrival date & time: 02/21/19  0014     History   Chief Complaint Chief Complaint  Patient presents with  . Exposure to STD    HPI Keith Rodriguez is a 33 y.o. male.     Patient presents to the emergency department with a chief complaint of tingling with urination.  He states that he has been having the symptoms for the past week.  He states that he recently had intercourse and the condom broke.  He denies any discharge.  Denies any testicle pain or swelling.  Denies any masses, sores, or lesions.  Denies any fevers or chills.  Denies any other associated symptoms.  The history is provided by the patient. No language interpreter was used.    Past Medical History:  Diagnosis Date  . Acid reflux     There are no active problems to display for this patient.   History reviewed. No pertinent surgical history.      Home Medications    Prior to Admission medications   Medication Sig Start Date End Date Taking? Authorizing Provider  omeprazole (PRILOSEC) 20 MG capsule Take 1 capsule (20 mg total) by mouth daily. 02/25/18   Forrest Moron, MD  psyllium (METAMUCIL MULTIHEALTH FIBER) 58.6 % powder Take 1 packet by mouth 2 (two) times daily at 8 am and 10 pm. 02/03/18   Forrest Moron, MD    Family History Family History  Problem Relation Age of Onset  . Lupus Mother   . Hypertension Mother   . Hyperlipidemia Mother   . Gout Father   . Hypertension Father   . Hyperlipidemia Father   . Mental illness Father   . Diabetes Sister   . Mental illness Sister   . ADD / ADHD Brother   . ADD / ADHD Brother     Social History Social History   Tobacco Use  . Smoking status: Former Research scientist (life sciences)  . Smokeless tobacco: Never Used  Substance Use Topics  . Alcohol use: Yes  . Drug use: No     Allergies   Patient has no known allergies.   Review of Systems Review of Systems  All other  systems reviewed and are negative.    Physical Exam Updated Vital Signs BP 135/74 (BP Location: Right Arm)   Pulse 78   Temp 98.9 F (37.2 C) (Oral)   Resp 18   SpO2 97%   Physical Exam Vitals signs and nursing note reviewed.  Constitutional:      General: He is not in acute distress.    Appearance: He is well-developed. He is not ill-appearing.  HENT:     Head: Normocephalic and atraumatic.  Eyes:     Conjunctiva/sclera: Conjunctivae normal.  Neck:     Musculoskeletal: Neck supple.  Cardiovascular:     Rate and Rhythm: Normal rate.  Pulmonary:     Effort: Pulmonary effort is normal. No respiratory distress.  Abdominal:     General: There is no distension.  Musculoskeletal:     Comments: Moves all extremities  Skin:    General: Skin is warm and dry.  Neurological:     Mental Status: He is alert and oriented to person, place, and time.  Psychiatric:        Mood and Affect: Mood normal.        Behavior: Behavior normal.      ED Treatments / Results  Labs (all labs ordered  are listed, but only abnormal results are displayed) Labs Reviewed  URINALYSIS, ROUTINE W REFLEX MICROSCOPIC    EKG None  Radiology No results found.  Procedures Procedures (including critical care time)  Medications Ordered in ED Medications  azithromycin (ZITHROMAX) tablet 1,000 mg (has no administration in time range)  cefTRIAXone (ROCEPHIN) injection 250 mg (has no administration in time range)     Initial Impression / Assessment and Plan / ED Course  I have reviewed the triage vital signs and the nursing notes.  Pertinent labs & imaging results that were available during my care of the patient were reviewed by me and considered in my medical decision making (see chart for details).        Patient with dysuria.  Urinalysis is unremarkable.  Concern for STD.  Will treat with Rocephin and azithromycin.  Final Clinical Impressions(s) / ED Diagnoses   Final diagnoses:   Urethritis    ED Discharge Orders    None       Roxy Horseman, PA-C 02/21/19 0346    Shon Baton, MD 02/21/19 [phone]

## 2019-07-29 ENCOUNTER — Emergency Department (HOSPITAL_COMMUNITY)
Admission: EM | Admit: 2019-07-29 | Discharge: 2019-07-30 | Disposition: A | Discharge status: Home / Self Care | Payer: Self-pay | Attending: Emergency Medicine | Admitting: Emergency Medicine

## 2019-07-29 ENCOUNTER — Other Ambulatory Visit: Payer: Self-pay

## 2019-07-29 ENCOUNTER — Encounter (HOSPITAL_COMMUNITY): Payer: Self-pay | Admitting: Emergency Medicine

## 2019-07-29 DIAGNOSIS — R220 Localized swelling, mass and lump, head: Secondary | ICD-10-CM | POA: Insufficient documentation

## 2019-07-29 DIAGNOSIS — Z5321 Procedure and treatment not carried out due to patient leaving prior to being seen by health care provider: Secondary | ICD-10-CM | POA: Insufficient documentation

## 2019-07-29 NOTE — ED Triage Notes (Addendum)
Patient reports small skin lump at his right anterior outer ear with pain when pressed for several days , denies injury /no hearing loss.

## 2019-07-30 ENCOUNTER — Encounter (HOSPITAL_COMMUNITY): Payer: Self-pay | Admitting: Emergency Medicine

## 2019-07-30 ENCOUNTER — Emergency Department (HOSPITAL_COMMUNITY): Payer: Self-pay

## 2019-07-30 ENCOUNTER — Emergency Department (HOSPITAL_COMMUNITY)
Admission: EM | Admit: 2019-07-30 | Discharge: 2019-07-30 | Disposition: A | Discharge status: Home / Self Care | Payer: Self-pay | Attending: Emergency Medicine | Admitting: Emergency Medicine

## 2019-07-30 ENCOUNTER — Other Ambulatory Visit: Payer: Self-pay

## 2019-07-30 DIAGNOSIS — R6884 Jaw pain: Secondary | ICD-10-CM | POA: Insufficient documentation

## 2019-07-30 DIAGNOSIS — Z87891 Personal history of nicotine dependence: Secondary | ICD-10-CM | POA: Insufficient documentation

## 2019-07-30 DIAGNOSIS — Z79899 Other long term (current) drug therapy: Secondary | ICD-10-CM | POA: Insufficient documentation

## 2019-07-30 LAB — CBC WITH DIFFERENTIAL/PLATELET
Abs Immature Granulocytes: 0.01 10*3/uL (ref 0.00–0.07)
Basophils Absolute: 0 10*3/uL (ref 0.0–0.1)
Basophils Relative: 0 %
Eosinophils Absolute: 0.1 10*3/uL (ref 0.0–0.5)
Eosinophils Relative: 1 %
HCT: 44.6 % (ref 39.0–52.0)
Hemoglobin: 14.7 g/dL (ref 13.0–17.0)
Immature Granulocytes: 0 %
Lymphocytes Relative: 32 %
Lymphs Abs: 2.1 10*3/uL (ref 0.7–4.0)
MCH: 29.4 pg (ref 26.0–34.0)
MCHC: 33 g/dL (ref 30.0–36.0)
MCV: 89.2 fL (ref 80.0–100.0)
Monocytes Absolute: 0.7 10*3/uL (ref 0.1–1.0)
Monocytes Relative: 11 %
Neutro Abs: 3.7 10*3/uL (ref 1.7–7.7)
Neutrophils Relative %: 56 %
Platelets: 239 10*3/uL (ref 150–400)
RBC: 5 MIL/uL (ref 4.22–5.81)
RDW: 12.6 % (ref 11.5–15.5)
WBC: 6.6 10*3/uL (ref 4.0–10.5)
nRBC: 0 % (ref 0.0–0.2)

## 2019-07-30 LAB — BASIC METABOLIC PANEL
Anion gap: 12 (ref 5–15)
BUN: 10 mg/dL (ref 6–20)
CO2: 25 mmol/L (ref 22–32)
Calcium: 9.6 mg/dL (ref 8.9–10.3)
Chloride: 102 mmol/L (ref 98–111)
Creatinine, Ser: 1.03 mg/dL (ref 0.61–1.24)
GFR calc Af Amer: >60 mL/min (ref >60)
GFR calc non Af Amer: >60 mL/min (ref >60)
Glucose, Bld: 104 mg/dL — ABNORMAL HIGH (ref 70–99)
Potassium: 3.7 mmol/L (ref 3.5–5.1)
Sodium: 139 mmol/L (ref 135–145)

## 2019-07-30 MED ORDER — SODIUM CHLORIDE 0.9 % IV BOLUS
1000.0000 mL | Freq: Once | INTRAVENOUS | Status: AC
  Administered 2019-07-30: 1000 mL via INTRAVENOUS

## 2019-07-30 MED ORDER — FENTANYL CITRATE (PF) 100 MCG/2ML IJ SOLN
50.0000 ug | Freq: Once | INTRAMUSCULAR | Status: AC
  Administered 2019-07-30: 50 ug via INTRAVENOUS
  Filled 2019-07-30: qty 2

## 2019-07-30 MED ORDER — IOHEXOL 300 MG/ML  SOLN
75.0000 mL | Freq: Once | INTRAMUSCULAR | Status: AC | PRN
  Administered 2019-07-30: 75 mL via INTRAVENOUS

## 2019-07-30 MED ORDER — MELOXICAM 15 MG PO TABS: 15.0000 mg | ORAL_TABLET | Freq: Every day | ORAL | 0 refills | Status: AC | PRN

## 2019-07-30 MED ORDER — KETOROLAC TROMETHAMINE 15 MG/ML IJ SOLN
15.0000 mg | Freq: Once | INTRAMUSCULAR | Status: AC
  Administered 2019-07-30: 15 mg via INTRAVENOUS
  Filled 2019-07-30: qty 1

## 2019-07-30 NOTE — ED Triage Notes (Signed)
Pt reports "lump" to R lateral face (just in front of ear) x 2 weeks and headache.  Took OTC migraine medication without relief.

## 2019-07-30 NOTE — ED Notes (Signed)
Patient states "my wife works here in the OR and she is pregnant I can't wait i'm just going to come back tomorrow when they have more staff so I can be seen quicker."

## 2019-07-30 NOTE — ED Notes (Addendum)
Pt presents ongoing w/Right ear pain and swelling around his jaw line x2 weeks. Pt reports he's unable to sleep and when he opens his mouth it feels like his head is going to explode.  Pt also reports a headache associated with his ear pain.

## 2019-07-30 NOTE — Discharge Instructions (Addendum)
You were seen in the emergency department today for jaw pain.  Your labs and CT scan were overall reassuring.  Your CT scan did not show any concerning masses.  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  We are sending you home with meloxicam to help with discomfort.  You may take meloxicam once per day as needed for pain and swelling, be sure to take this with food as it can cause stomach upset and it or stomach bleeding.  Do not take other NSAID such as ibuprofen, Advil, Aleve, Motrin, Goody powder, naproxen, etc. with this medication as they are similar.  You may take Tylenol with this medication safely.  We have prescribed you new medication(s) today. Discuss the medications prescribed today with your pharmacist as they can have adverse effects and interactions with your other medicines including over the counter and prescribed medications. Seek medical evaluation if you start to experience new or abnormal symptoms after taking one of these medicines, seek care immediately if you start to experience difficulty breathing, feeling of your throat closing, facial swelling, or rash as these could be indications of a more serious allergic reaction  Please follow-up with [facility] care provider within 3 days for reevaluation.  Return to the ER for new or worsening symptoms including but not limited to increased pain, fever, inability to open your mouth, neck stiffness, change in your vision, numbness, weakness, passing out, or any other concerns.

## 2019-07-30 NOTE — ED Provider Notes (Signed)
[facility] EMERGENCY DEPARTMENT Provider Note   CSN: [id number] Arrival date & time: 07/30/19  3536     History Chief Complaint  Patient presents with  . Facial Swelling    Marcelino Campos is a 34 y.o. male with a history of GERD who presents to the ED with complaints of right-sided facial mass which has been present for 2 weeks.  Patient states that he has noted a round mass to just in front of his right ear/jaw area, states it is painful, worse with opening of the mouth, no alleviating factors.  The discomfort is radiating to his entire head and causing headaches.  He has tried ibuprofen without relief.  Denies fever, chills, nasal congestion, ear pain, ear drainage, dental pain, intraoral drainage, sore throat, cough, or any other palpated masses.  HPI     Past Medical History:  Diagnosis Date  . Acid reflux     There are no problems to display for this patient.   History reviewed. No pertinent surgical history.     Family History  Problem Relation Age of Onset  . Lupus Mother   . Hypertension Mother   . Hyperlipidemia Mother   . Gout Father   . Hypertension Father   . Hyperlipidemia Father   . Mental illness Father   . Diabetes Sister   . Mental illness Sister   . ADD / ADHD Brother   . ADD / ADHD Brother     Social History   Tobacco Use  . Smoking status: Former Games developer  . Smokeless tobacco: Never Used  Substance Use Topics  . Alcohol use: Yes  . Drug use: No    Home Medications Prior to Admission medications   Medication Sig Start Date End Date Taking? Authorizing Provider  omeprazole (PRILOSEC) 20 MG capsule Take 1 capsule (20 mg total) by mouth daily. 02/25/18   Doristine Bosworth, MD  psyllium (METAMUCIL MULTIHEALTH FIBER) 58.6 % powder Take 1 packet by mouth 2 (two) times daily at 8 am and 10 pm. 02/03/18   Doristine Bosworth, MD    Allergies    Patient has no known allergies.  Review of Systems   Review of Systems    Constitutional: Negative for chills and fever.  HENT: Positive for facial swelling (and mass, painful). Negative for congestion, dental problem, drooling, ear discharge, ear pain, sinus pressure and sore throat.           Respiratory: Negative for cough and shortness of breath.   Cardiovascular: Negative for chest pain.  Gastrointestinal: Negative for abdominal pain, nausea and vomiting.  Neurological: Positive for headaches. Negative for dizziness, seizures, syncope, speech difficulty, weakness and numbness.  All other systems reviewed and are negative.   Physical Exam Updated Vital Signs BP (!) 154/99 (BP Location: Right Arm)   Pulse 82   Temp 97.9 F (36.6 C) (Oral)   Resp 16   Ht 5\' 9"  (1.753 m)   Wt 95.3 kg   SpO2 100%   BMI 31.01 kg/m   Physical Exam Vitals and nursing note reviewed.  Constitutional:      General: He is not in acute distress.    Appearance: He is well-developed. He is not toxic-appearing.  HENT:     Head: Normocephalic and atraumatic.      Right Ear: Ear canal normal. No decreased hearing noted. No drainage or swelling. Tympanic membrane is not perforated, erythematous, retracted or bulging.     Left Ear: Ear canal normal. No  decreased hearing noted. No drainage or swelling. Tympanic membrane is not perforated, erythematous, retracted or bulging.     Ears:     Comments: No mastoid erythema/swellng/tenderness.     Nose: Nose normal.     Right Sinus: No maxillary sinus tenderness or frontal sinus tenderness.     Left Sinus: No maxillary sinus tenderness or frontal sinus tenderness.     Mouth/Throat:     Pharynx: Oropharynx is clear. Uvula midline. No oropharyngeal exudate or posterior oropharyngeal erythema.     Comments: Posterior oropharynx is symmetric appearing. Patient tolerating own secretions without difficulty. No trismus. No drooling. No hot potato voice. No swelling beneath the tongue, submandibular compartment is soft.  Eyes:     General:         Right eye: No discharge.        Left eye: No discharge.     Conjunctiva/sclera: Conjunctivae normal.  Cardiovascular:     Rate and Rhythm: Normal rate and regular rhythm.  Pulmonary:     Effort: Pulmonary effort is normal. No respiratory distress.     Breath sounds: Normal breath sounds. No wheezing, rhonchi or rales.  Abdominal:     General: There is no distension.     Palpations: Abdomen is soft.     Tenderness: There is no abdominal tenderness.  Musculoskeletal:     Cervical back: Neck supple. No rigidity.  Lymphadenopathy:     Cervical: No cervical adenopathy.  Skin:    General: Skin is warm and dry.     Findings: No rash.  Neurological:     Mental Status: He is alert.     Comments: Alert. Clear speech. CN III-XII grossly intact. Sensation & strength grossly intact x 4. Patient is ambulatory.   Psychiatric:        Behavior: Behavior normal.    ED Results / Procedures / Treatments   Labs (all labs ordered are listed, but only abnormal results are displayed) Labs Reviewed  BASIC METABOLIC PANEL - Abnormal; Notable for the following components:      Result Value   Glucose, Bld 104 (*)    All other components within normal limits  CBC WITH DIFFERENTIAL/PLATELET    EKG None  Radiology CT Maxillofacial W Contrast  Result Date: 07/30/2019 CLINICAL DATA:  Right facial lump EXAM: CT MAXILLOFACIAL WITH CONTRAST TECHNIQUE: Multidetector CT imaging of the maxillofacial structures was performed with intravenous contrast. Multiplanar CT image reconstructions were also generated. CONTRAST:  76mL OMNIPAQUE IOHEXOL 300 MG/ML  SOLN COMPARISON:  None. FINDINGS: Osseous: Negative. Orbits: Unremarkable. Sinuses: Minor mucosal thickening. Soft tissues: No mass or adenopathy.  Major neck vessels are patent. Limited intracranial: No abnormal enhancement. IMPRESSION: No mass or adenopathy. Electronically Signed   By: Guadlupe Spanish M.D.   On: 07/30/2019 11:57    Procedures Procedures  (including critical care time)  Medications Ordered in ED Medications - No data to display  ED Course  I have reviewed the triage vital signs and the nursing notes.  Pertinent labs & imaging results that were available during my care of the patient were reviewed by me and considered in my medical decision making (see chart for details).    MDM Rules/Calculators/A&P                     Patient presents to the ED with complaints of right-sided painful jaw mass for the past 2 weeks.  He is nontoxic, resting comfortably, vitals WNL with the exception of mildly elevated  blood pressure, doubt HTN emergency.  On exam I do feel a discrete firm masslike structure to the right jaw.  CT maxillofacial scan was obtained to further evaluate for mass, sialoadenitis, abscess-overall reassuring.  Labs also fairly unremarkable.  I personally reviewed labs and imaging.  No overlying skin erythema/warmth, afebrile, does not seem like a lymphadenitis, abscess, or cellulitis.  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  PCP follow-up. I discussed results, treatment plan, need for follow-up, and return precautions with the patient. Provided opportunity for questions, patient confirmed understanding and is in agreement with plan.   Findings and plan of care discussed with supervising physician Dr. Sherry Ruffing who is in agreement.   Final Clinical Impression(s) / ED Diagnoses Final diagnoses:  Jaw pain    Rx / DC Orders ED Discharge Orders         Ordered    meloxicam (MOBIC) 15 MG tablet  Daily PRN     07/30/19 1314           Ahmani Prehn, [place] R, PA-C 07/30/19 1317    Tegeler, Gwenyth Allegra, MD 07/30/19 [phone]

## 2019-07-30 NOTE — ED Notes (Signed)
Patient transported to CT 
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
## 2022-09-02 ENCOUNTER — Emergency Department (HOSPITAL_COMMUNITY): Payer: Self-pay

## 2022-09-02 ENCOUNTER — Other Ambulatory Visit: Payer: Self-pay

## 2022-09-02 ENCOUNTER — Emergency Department (HOSPITAL_COMMUNITY)
Admission: EM | Admit: 2022-09-02 | Discharge: 2022-09-02 | Disposition: A | Discharge status: Home / Self Care | Payer: Self-pay | Attending: Emergency Medicine | Admitting: Emergency Medicine

## 2022-09-02 DIAGNOSIS — S0031XA Abrasion of nose, initial encounter: Secondary | ICD-10-CM | POA: Insufficient documentation

## 2022-09-02 DIAGNOSIS — S0083XA Contusion of other part of head, initial encounter: Secondary | ICD-10-CM

## 2022-09-02 DIAGNOSIS — T07XXXA Unspecified multiple injuries, initial encounter: Secondary | ICD-10-CM | POA: Insufficient documentation

## 2022-09-02 DIAGNOSIS — S0093XA Contusion of unspecified part of head, initial encounter: Secondary | ICD-10-CM | POA: Insufficient documentation

## 2022-09-02 DIAGNOSIS — S20212A Contusion of left front wall of thorax, initial encounter: Secondary | ICD-10-CM | POA: Insufficient documentation

## 2022-09-02 LAB — COMPREHENSIVE METABOLIC PANEL
ALT: 32 U/L (ref 0–44)
AST: 45 U/L — ABNORMAL HIGH (ref 15–41)
Albumin: 4.9 g/dL (ref 3.5–5.0)
Alkaline Phosphatase: 39 U/L (ref 38–126)
Anion gap: 11 (ref 5–15)
BUN: 17 mg/dL (ref 6–20)
CO2: 23 mmol/L (ref 22–32)
Calcium: 9.6 mg/dL (ref 8.9–10.3)
Chloride: 102 mmol/L (ref 98–111)
Creatinine, Ser: 1.29 mg/dL — ABNORMAL HIGH (ref 0.61–1.24)
GFR, Estimated: >60 mL/min (ref >60)
Glucose, Bld: 114 mg/dL — ABNORMAL HIGH (ref 70–99)
Potassium: 3.8 mmol/L (ref 3.5–5.1)
Sodium: 136 mmol/L (ref 135–145)
Total Bilirubin: 0.9 mg/dL (ref 0.3–1.2)
Total Protein: 8.1 g/dL (ref 6.5–8.1)

## 2022-09-02 LAB — CBC WITH DIFFERENTIAL/PLATELET
Abs Immature Granulocytes: 0.06 10*3/uL (ref 0.00–0.07)
Basophils Absolute: 0 10*3/uL (ref 0.0–0.1)
Basophils Relative: 0 %
Eosinophils Absolute: 0 10*3/uL (ref 0.0–0.5)
Eosinophils Relative: 0 %
HCT: 44.9 % (ref 39.0–52.0)
Hemoglobin: 15.1 g/dL (ref 13.0–17.0)
Immature Granulocytes: 1 %
Lymphocytes Relative: 13 %
Lymphs Abs: 1.6 10*3/uL (ref 0.7–4.0)
MCH: 29.6 pg (ref 26.0–34.0)
MCHC: 33.6 g/dL (ref 30.0–36.0)
MCV: 88 fL (ref 80.0–100.0)
Monocytes Absolute: 1 10*3/uL (ref 0.1–1.0)
Monocytes Relative: 8 %
Neutro Abs: 9.6 10*3/uL — ABNORMAL HIGH (ref 1.7–7.7)
Neutrophils Relative %: 78 %
Platelets: 257 10*3/uL (ref 150–400)
RBC: 5.1 MIL/uL (ref 4.22–5.81)
RDW: 12.8 % (ref 11.5–15.5)
WBC: 12.3 10*3/uL — ABNORMAL HIGH (ref 4.0–10.5)
nRBC: 0 % (ref 0.0–0.2)

## 2022-09-02 LAB — LIPASE, BLOOD: Lipase: 33 U/L (ref 11–51)

## 2022-09-02 MED ORDER — METOCLOPRAMIDE HCL 5 MG/ML IJ SOLN
10.0000 mg | Freq: Once | INTRAMUSCULAR | Status: AC
  Administered 2022-09-02: 10 mg via INTRAVENOUS
  Filled 2022-09-02: qty 2

## 2022-09-02 MED ORDER — FENTANYL CITRATE PF 50 MCG/ML IJ SOSY
50.0000 ug | PREFILLED_SYRINGE | Freq: Once | INTRAMUSCULAR | Status: AC
  Administered 2022-09-02: 50 ug via INTRAVENOUS
  Filled 2022-09-02: qty 1

## 2022-09-02 MED ORDER — KETOROLAC TROMETHAMINE 15 MG/ML IJ SOLN
15.0000 mg | Freq: Once | INTRAMUSCULAR | Status: AC
  Administered 2022-09-02: 15 mg via INTRAVENOUS
  Filled 2022-09-02: qty 1

## 2022-09-02 MED ORDER — IOHEXOL 350 MG/ML SOLN
75.0000 mL | Freq: Once | INTRAVENOUS | Status: AC | PRN
  Administered 2022-09-02: 75 mL via INTRAVENOUS

## 2022-09-02 NOTE — ED Notes (Signed)
Pt brought back to room 33. Ambulatory. Undress and placed in hospital gown for assessment

## 2022-09-02 NOTE — ED Notes (Signed)
Patient transported to CT 
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
## 2023-04-21 ENCOUNTER — Other Ambulatory Visit: Payer: Self-pay

## 2023-04-21 ENCOUNTER — Encounter (HOSPITAL_BASED_OUTPATIENT_CLINIC_OR_DEPARTMENT_OTHER): Payer: Self-pay

## 2023-04-21 ENCOUNTER — Emergency Department (HOSPITAL_BASED_OUTPATIENT_CLINIC_OR_DEPARTMENT_OTHER)
Admission: EM | Admit: 2023-04-21 | Discharge: 2023-04-21 | Disposition: A | Discharge status: Home / Self Care | Payer: Self-pay | Attending: Emergency Medicine | Admitting: Emergency Medicine

## 2023-04-21 DIAGNOSIS — Z202 Contact with and (suspected) exposure to infections with a predominantly sexual mode of transmission: Secondary | ICD-10-CM

## 2023-04-21 DIAGNOSIS — N4889 Other specified disorders of penis: Secondary | ICD-10-CM | POA: Insufficient documentation

## 2023-04-21 LAB — URINALYSIS, ROUTINE W REFLEX MICROSCOPIC
Bilirubin Urine: NEGATIVE
Glucose, UA: NEGATIVE mg/dL
Hgb urine dipstick: NEGATIVE
Ketones, ur: NEGATIVE mg/dL
Leukocytes,Ua: NEGATIVE
Nitrite: NEGATIVE
Protein, ur: NEGATIVE mg/dL
Specific Gravity, Urine: 1.025 (ref 1.005–1.030)
pH: 5.5 (ref 5.0–8.0)

## 2023-04-21 MED ORDER — DOXYCYCLINE HYCLATE 100 MG PO CAPS: 100.0000 mg | ORAL_CAPSULE | Freq: Two times a day (BID) | ORAL | 0 refills | Status: AC

## 2023-04-21 MED ORDER — CEFTRIAXONE SODIUM 500 MG IJ SOLR
500.0000 mg | Freq: Once | INTRAMUSCULAR | Status: AC
  Administered 2023-04-21: 500 mg via INTRAMUSCULAR
  Filled 2023-04-21: qty 500

## 2023-04-21 MED ORDER — DOXYCYCLINE HYCLATE 100 MG PO TABS
100.0000 mg | ORAL_TABLET | Freq: Once | ORAL | Status: AC
  Administered 2023-04-21: 100 mg via ORAL
  Filled 2023-04-21: qty 1
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
## 2023-04-21 NOTE — ED Notes (Signed)
Flowsheet tab not appearing on epic screen.  BP 145/89 HR 91 Resp 17 O2 99% Temp 98.2

## 2023-04-21 NOTE — ED Provider Notes (Signed)
[facility] Provider Note   CSN: [id number] Arrival date & time: 04/21/23  1411     History  Chief Complaint  Patient presents with   Groin Pain    Keith Rodriguez is a 38 y.o. male here presenting with concern for possible STD.  Patient just came back from [place].  He states that while he was there he had multiple sexual partners.  He states that he did not use any protection.  He states that they are male partners and he does not know if they have any STDs.  Patient states that he feels that his inguinal lymph nodes are swollen.  He denies any trouble urinating or penile discharge.  He is concerned that he may have an STD.  The history is provided by the patient.       Home Medications Prior to Admission medications   Medication Sig Start Date End Date Taking? Authorizing Provider  aspirin-acetaminophen-caffeine (EXCEDRIN MIGRAINE) [phone] MG tablet Take 1 tablet by mouth every 6 (six) hours as needed for headache.    [provider]  ibuprofen (ADVIL) 200 MG tablet Take 200 mg by mouth every 6 (six) hours as needed for headache or mild pain.    [provider]  meloxicam (MOBIC) 15 MG tablet Take 1 tablet (15 mg total) by mouth daily as needed for pain. 07/30/19   Petrucelli, Samantha R, PA-C  omeprazole (PRILOSEC) 20 MG capsule Take 1 capsule (20 mg total) by mouth daily. Patient not taking: Reported on 07/30/2019 02/25/18 07/30/19  Doristine Bosworth, MD      Allergies    Patient has no known allergies.    Review of Systems   Review of Systems  Genitourinary:  Positive for penile pain.  All other systems reviewed and are negative.   Physical Exam Updated Vital Signs BP (!) 149/90 (BP Location: Left Arm)   Pulse 98   Temp 97.9 F (36.6 C)   Resp 18   Ht 5\' 9"  (1.753 m)   Wt 93 kg   SpO2 95%   BMI 30.27 kg/m  Physical Exam Vitals and nursing note reviewed.  Constitutional:      Appearance:  Normal appearance.  HENT:     Head: Normocephalic.     Nose: Nose normal.     Mouth/Throat:     Mouth: Mucous membranes are moist.  Eyes:     Extraocular Movements: Extraocular movements intact.     Pupils: Pupils are equal, round, and reactive to light.  Cardiovascular:     Rate and Rhythm: Normal rate and regular rhythm.     Pulses: Normal pulses.     Heart sounds: Normal heart sounds.  Pulmonary:     Effort: Pulmonary effort is normal.     Breath sounds: Normal breath sounds.  Abdominal:     General: Abdomen is flat.     Palpations: Abdomen is soft.  Genitourinary:    Comments: Patient has bilateral inguinal lymphadenopathy.  Patient has no obvious penile rash.  No testicular tenderness Musculoskeletal:        General: Normal range of motion.     Cervical back: Normal range of motion and neck supple.  Skin:    General: Skin is warm.     Capillary Refill: Capillary refill takes less than 2 seconds.  Neurological:     General: No focal deficit present.     Mental Status: He is alert.  Psychiatric:  Mood and Affect: Mood normal.        Behavior: Behavior normal.     ED Results / Procedures / Treatments   Labs (all labs ordered are listed, but only abnormal results are displayed) Labs Reviewed  URINALYSIS, ROUTINE W REFLEX MICROSCOPIC  GC/CHLAMYDIA PROBE AMP ([place]) NOT AT [facility]    EKG None  Radiology No results found.  Procedures Procedures    Medications Ordered in ED Medications  cefTRIAXone (ROCEPHIN) injection 500 mg (has no administration in time range)  doxycycline (VIBRA-TABS) tablet 100 mg (has no administration in time range)    ED Course/ Medical Decision Making/ A&P                                 Medical Decision Making Harfateh Cleto is a 38 y.o. male here presenting with concern for possible STD.  Patient had unprotected sex with multiple sexual partners recently.  Patient has some inguinal lymphadenopathy.  UA is unremarkable.   GC chlamydia was sent.  Patient is agreeable for empiric treatment.  Told him that he will see the results on MyChart in several days and he can stop taking doxycycline if chlamydia test is negative.   Problems Addressed: Possible exposure to STD: acute illness or injury  Amount and/or Complexity of Data Reviewed Labs: ordered. Decision-making details documented in ED Course.  Risk Prescription drug management.    Final Clinical Impression(s) / ED Diagnoses Final diagnoses:  None    Rx / DC Orders ED Discharge Orders     None         Charlynne Pander, MD 04/21/23 1655

## 2023-04-21 NOTE — Discharge Instructions (Signed)
As we discussed, your chlamydia and gonorrhea tests will take 1 to 2 days to come back.  You were given Rocephin to treat for gonorrhea.  I have prescribed doxycycline twice a day for a week to treat for chlamydia.  If your chlamydia test is negative you can discontinue taking the doxycycline  See your doctor for follow-up  Please use condoms   Return to ER if you have worse inguinal swelling or testicular pain or penile pain

## 2023-04-22 LAB — GC/CHLAMYDIA PROBE AMP (~~LOC~~) NOT AT ARMC
Chlamydia: NEGATIVE
Comment: NEGATIVE
Comment: NORMAL
Neisseria Gonorrhea: NEGATIVE

## 2023-05-11 ENCOUNTER — Telehealth (HOSPITAL_COMMUNITY): Payer: Self-pay

## 2023-05-14 ENCOUNTER — Other Ambulatory Visit: Payer: Self-pay

## 2023-05-14 ENCOUNTER — Encounter (HOSPITAL_BASED_OUTPATIENT_CLINIC_OR_DEPARTMENT_OTHER): Payer: Self-pay

## 2023-05-14 ENCOUNTER — Emergency Department (HOSPITAL_BASED_OUTPATIENT_CLINIC_OR_DEPARTMENT_OTHER): Payer: Self-pay

## 2023-05-14 ENCOUNTER — Emergency Department (HOSPITAL_BASED_OUTPATIENT_CLINIC_OR_DEPARTMENT_OTHER)
Admission: EM | Admit: 2023-05-14 | Discharge: 2023-05-14 | Disposition: A | Discharge status: Home / Self Care | Payer: Self-pay

## 2023-05-14 DIAGNOSIS — J392 Other diseases of pharynx: Secondary | ICD-10-CM | POA: Insufficient documentation

## 2023-05-14 DIAGNOSIS — M79659 Pain in unspecified thigh: Secondary | ICD-10-CM | POA: Insufficient documentation

## 2023-05-14 DIAGNOSIS — R1084 Generalized abdominal pain: Secondary | ICD-10-CM | POA: Insufficient documentation

## 2023-05-14 DIAGNOSIS — R109 Unspecified abdominal pain: Secondary | ICD-10-CM

## 2023-05-14 LAB — URINALYSIS, ROUTINE W REFLEX MICROSCOPIC
Bilirubin Urine: NEGATIVE
Glucose, UA: NEGATIVE mg/dL
Hgb urine dipstick: NEGATIVE
Ketones, ur: NEGATIVE mg/dL
Leukocytes,Ua: NEGATIVE
Nitrite: NEGATIVE
Protein, ur: NEGATIVE mg/dL
Specific Gravity, Urine: 1.02 (ref 1.005–1.030)
pH: 5.5 (ref 5.0–8.0)

## 2023-05-14 LAB — RESP PANEL BY RT-PCR (RSV, FLU A&B, COVID)  RVPGX2
Influenza A by PCR: NEGATIVE
Influenza B by PCR: NEGATIVE
Resp Syncytial Virus by PCR: NEGATIVE
SARS Coronavirus 2 by RT PCR: NEGATIVE

## 2023-05-14 LAB — CBC WITH DIFFERENTIAL/PLATELET
Abs Immature Granulocytes: 0.01 10*3/uL (ref 0.00–0.07)
Basophils Absolute: 0 10*3/uL (ref 0.0–0.1)
Basophils Relative: 1 %
Eosinophils Absolute: 0.1 10*3/uL (ref 0.0–0.5)
Eosinophils Relative: 2 %
HCT: 44.5 % (ref 39.0–52.0)
Hemoglobin: 14.8 g/dL (ref 13.0–17.0)
Immature Granulocytes: 0 %
Lymphocytes Relative: 44 %
Lymphs Abs: 1.9 10*3/uL (ref 0.7–4.0)
MCH: 29.7 pg (ref 26.0–34.0)
MCHC: 33.3 g/dL (ref 30.0–36.0)
MCV: 89.2 fL (ref 80.0–100.0)
Monocytes Absolute: 0.4 10*3/uL (ref 0.1–1.0)
Monocytes Relative: 9 %
Neutro Abs: 1.8 10*3/uL (ref 1.7–7.7)
Neutrophils Relative %: 44 %
Platelets: 247 10*3/uL (ref 150–400)
RBC: 4.99 MIL/uL (ref 4.22–5.81)
RDW: 12.8 % (ref 11.5–15.5)
WBC: 4.2 10*3/uL (ref 4.0–10.5)
nRBC: 0 % (ref 0.0–0.2)

## 2023-05-14 LAB — COMPREHENSIVE METABOLIC PANEL
ALT: 33 U/L (ref 0–44)
AST: 28 U/L (ref 15–41)
Albumin: 4.3 g/dL (ref 3.5–5.0)
Alkaline Phosphatase: 38 U/L (ref 38–126)
Anion gap: 9 (ref 5–15)
BUN: 16 mg/dL (ref 6–20)
CO2: 27 mmol/L (ref 22–32)
Calcium: 9.7 mg/dL (ref 8.9–10.3)
Chloride: 100 mmol/L (ref 98–111)
Creatinine, Ser: 1.03 mg/dL (ref 0.61–1.24)
GFR, Estimated: >60 mL/min (ref >60)
Glucose, Bld: 97 mg/dL (ref 70–99)
Potassium: 4.5 mmol/L (ref 3.5–5.1)
Sodium: 136 mmol/L (ref 135–145)
Total Bilirubin: 0.6 mg/dL (ref 0.0–1.2)
Total Protein: 7.2 g/dL (ref 6.5–8.1)

## 2023-05-14 LAB — LIPASE, BLOOD: Lipase: 25 U/L (ref 11–51)

## 2023-05-14 LAB — HIV ANTIBODY (ROUTINE TESTING W REFLEX): HIV Screen 4th Generation wRfx: NONREACTIVE

## 2023-05-14 LAB — MONONUCLEOSIS SCREEN: Mono Screen: NEGATIVE

## 2023-05-14 MED ORDER — IOHEXOL 300 MG/ML  SOLN
80.0000 mL | Freq: Once | INTRAMUSCULAR | Status: AC | PRN
  Administered 2023-05-14: 80 mL via INTRAVENOUS

## 2023-05-14 MED ORDER — ALUM & MAG HYDROXIDE-SIMETH 200-200-20 MG/5ML PO SUSP
30.0000 mL | Freq: Once | ORAL | Status: AC
  Administered 2023-05-14: 30 mL via ORAL
  Filled 2023-05-14: qty 30

## 2023-05-14 NOTE — Discharge Instructions (Addendum)
You were seen in the emergency department for your abdominal pain.  Your vital signs, physical exam, lab work and imaging were all reassuring.  Does not appear that you have any life-threatening or emergent injuries or illnesses that require acute intervention.  As such, we feel that you are safe to discharge and follow-up with your primary doctor and gastroenterologist.  It is unclear precisely what is causing her symptoms, you may try over-the-counter medications such as Tylenol, ibuprofen or Tums to see if they help your symptoms. If you do not have a primary doctor, please see the discharge instructions to help find one.  We have provided you the number to the gastroenterologist, please call to schedule appointment.  Return immediately if develop fevers, chills, chest pain, shortness of breath, worsening abdominal pain, inability eat or drink due to nausea vomiting, stop having bowel movements, or you develop any new or worsening symptoms that are concerning to you.

## 2023-05-14 NOTE — ED Provider Notes (Signed)
[place] [facility] AT [facility] Provider Note   CSN: [id number] Arrival date & time: 05/14/23  5284     History  Chief Complaint  Patient presents with   Abdominal Pain    Leyland Kenna is a 38 y.o. male.  This is a 38 year old male presenting emergency department with multiple complaints.  Symptoms seemingly subacute/chronic as he has been dealing with them for the past several months.  Reports symptoms started while he was in the [place] with crampy generalized abdominal pain.  Reports some discomfort in his groin.  Also complaining of some burning sensation in his throat and intermittent sharp pain in his upper thighs.  Denies any fevers, chills, painful difficulty swallowing, no neck stiffness or photophobia.  No rash.  No skin lesions.  No nausea or vomiting.  Reports no significant change in bowel movements. Does note fatigue. Reports unprotected intercourse following [place] was seen in the emergency department roughly a month ago and treated with antibiotics although his symptoms have persisted.   Abdominal Pain      Home Medications Prior to Admission medications   Medication Sig Start Date End Date Taking? Authorizing Provider  aspirin-acetaminophen-caffeine (EXCEDRIN MIGRAINE) [phone] MG tablet Take 1 tablet by mouth every 6 (six) hours as needed for headache.    [provider]  doxycycline (VIBRAMYCIN) 100 MG capsule Take 1 capsule (100 mg total) by mouth 2 (two) times daily. 04/21/23   Charlynne Pander, MD  ibuprofen (ADVIL) 200 MG tablet Take 200 mg by mouth every 6 (six) hours as needed for headache or mild pain.    [provider]  meloxicam (MOBIC) 15 MG tablet Take 1 tablet (15 mg total) by mouth daily as needed for pain. 07/30/19   Petrucelli, Samantha R, PA-C  omeprazole (PRILOSEC) 20 MG capsule Take 1 capsule (20 mg total) by mouth daily. Patient not taking: Reported on 07/30/2019 02/25/18 07/30/19   Doristine Bosworth, MD      Allergies    Patient has no known allergies.    Review of Systems   Review of Systems  Gastrointestinal:  Positive for abdominal pain.    Physical Exam Updated Vital Signs BP 125/87   Pulse (!) 58   Temp 98.5 F (36.9 C)   Resp 18   Ht 5\' 9"  (1.753 m)   Wt 93 kg   SpO2 97%   BMI 30.27 kg/m  Physical Exam Vitals and nursing note reviewed.  Constitutional:      General: He is not in acute distress.    Appearance: He is not toxic-appearing.  HENT:     Head: Normocephalic.     Mouth/Throat:     Mouth: Mucous membranes are moist.  Cardiovascular:     Rate and Rhythm: Normal rate and regular rhythm.  Pulmonary:     Effort: Pulmonary effort is normal.     Breath sounds: Normal breath sounds. No wheezing, rhonchi or rales.  Abdominal:     General: Abdomen is flat.     Palpations: Abdomen is soft.     Tenderness: There is no abdominal tenderness. There is no guarding or rebound.     Hernia: No hernia is present.  Genitourinary:    Penis: Normal.      Testes: Normal.  Skin:    General: Skin is warm and dry.     Capillary Refill: Capillary refill takes less than 2 seconds.     Findings: No erythema or rash.  Neurological:  General: No focal deficit present.     Mental Status: He is alert.  Psychiatric:        Mood and Affect: Mood normal.        Behavior: Behavior normal.     ED Results / Procedures / Treatments   Labs (all labs ordered are listed, but only abnormal results are displayed) Labs Reviewed  RESP PANEL BY RT-PCR (RSV, FLU A&B, COVID)  [id number]  CBC WITH DIFFERENTIAL/PLATELET  COMPREHENSIVE METABOLIC PANEL  LIPASE, BLOOD  URINALYSIS, ROUTINE W REFLEX MICROSCOPIC  HIV ANTIBODY (ROUTINE TESTING W REFLEX)  RPR  MONONUCLEOSIS SCREEN    EKG None  Radiology No results found.  Procedures Procedures    Medications Ordered in ED Medications  alum & mag hydroxide-simeth (MAALOX/MYLANTA) 200-200-20 MG/5ML suspension  30 mL (30 mLs Oral Given 05/14/23 1243)  iohexol (OMNIPAQUE) 300 MG/ML solution 80 mL (80 mLs Intravenous Contrast Given 05/14/23 1442)    ED Course/ Medical Decision Making/ A&P                                 Medical Decision Making This is a well-appearing 38 year old male with a past medical history of acid reflux presenting the emergency department with multiple complaints, seemingly primarily concerned with his abdominal pain that has been ongoing for the past several months.  He is afebrile nontachycardic hemodynamically stable.  Per chart review was treated for STIs last month.  He has a benign abdominal exam.  Nonfocal neurologic exam.  No rashes, lesions or fevers to suspect infectious process.  Although, was in the [place].  Broad lab workup given unclear picture largely reassuring.  CBC with no leukocytosis.  No eosinophilia to suggest parasitic infection.  His comprehensive metabolic panel without abnormality.  No transaminitis to suggest hepatobiliary disease or elevated bilirubin.  Lipase normal.  Pancreatitis unlikely.  Complaining of pain in his groin and lymph nodes.  Per chart review and negative STI testing.  Monotest today negative.  Constellation of symptoms could be secondary to viral etiology given waxing and waning of symptoms.  However flu/RSV/COVID-negative.  Per chart review had a negative CT chest abdomen pelvis 6 5/24 no significant abnormalities on it.  However was not having current symptoms.  Will get CT abdomen to exclude malignant/metastatic process.  Given GI cocktail for supportive relief.  If CT scan negative will likely discharge with PCP/GI follow-up as he has normal vital signs reassuring exam and negative lab workup, I do not believe he would benefit from acute hospitalization at this time  Amount and/or Complexity of Data Reviewed Labs: ordered. Radiology: ordered.  Risk OTC drugs. Prescription drug management. Decision regarding  hospitalization.         Final Clinical Impression(s) / ED Diagnoses Final diagnoses:  Abdominal pain, unspecified abdominal location    Rx / DC Orders ED Discharge Orders     None         Coral Spikes, DO 05/19/23 0708

## 2023-05-14 NOTE — ED Triage Notes (Signed)
In for follow up for abd pain and groin pain. Was in [place] for 9 months. STI testing was negative.

## 2023-05-14 NOTE — ED Provider Notes (Signed)
Care was taken over from Dr. Maple Hudson.  Awaiting CT scan results.  CT scan does not show any acute process.  There is a stone in the right kidney but no ureteral stones.  Labs reviewed and are nonconcerning.  He was discharged with discharge instructions per Dr. Maple Hudson.  Return precautions were given.   Rolan Bucco, MD 05/14/23 [phone]

## 2023-05-15 LAB — RPR: RPR Ser Ql: NONREACTIVE
